# Patient Record
Sex: Male | Born: 1950
Health system: Southern US, Community
[De-identification: ages and names within clinical notes are randomized; demographics above are authoritative.]

## PROBLEM LIST (undated history)

## (undated) DIAGNOSIS — F32A Depression, unspecified: Secondary | ICD-10-CM

## (undated) DIAGNOSIS — I77811 Abdominal aortic ectasia: Secondary | ICD-10-CM

## (undated) DIAGNOSIS — M1A00X0 Idiopathic chronic gout, unspecified site, without tophus (tophi): Secondary | ICD-10-CM

## (undated) DIAGNOSIS — M109 Gout, unspecified: Secondary | ICD-10-CM

## (undated) DIAGNOSIS — M51369 Other intervertebral disc degeneration, lumbar region without mention of lumbar back pain or lower extremity pain: Secondary | ICD-10-CM

## (undated) DIAGNOSIS — N400 Enlarged prostate without lower urinary tract symptoms: Secondary | ICD-10-CM

## (undated) DIAGNOSIS — N189 Chronic kidney disease, unspecified: Secondary | ICD-10-CM

## (undated) DIAGNOSIS — M1710 Unilateral primary osteoarthritis, unspecified knee: Secondary | ICD-10-CM

## (undated) DIAGNOSIS — K219 Gastro-esophageal reflux disease without esophagitis: Secondary | ICD-10-CM

## (undated) DIAGNOSIS — E785 Hyperlipidemia, unspecified: Secondary | ICD-10-CM

## (undated) DIAGNOSIS — I10 Essential (primary) hypertension: Secondary | ICD-10-CM

## (undated) DIAGNOSIS — M1A9XX0 Chronic gout, unspecified, without tophus (tophi): Secondary | ICD-10-CM

## (undated) DIAGNOSIS — M5136 Other intervertebral disc degeneration, lumbar region: Secondary | ICD-10-CM

## (undated) DIAGNOSIS — E119 Type 2 diabetes mellitus without complications: Secondary | ICD-10-CM

## (undated) DIAGNOSIS — M1990 Unspecified osteoarthritis, unspecified site: Secondary | ICD-10-CM

## (undated) DIAGNOSIS — F329 Major depressive disorder, single episode, unspecified: Secondary | ICD-10-CM

## (undated) HISTORY — PX: CHOLECYSTECTOMY: SHX55

## (undated) HISTORY — DX: Major depressive disorder, single episode, unspecified: F32.9

## (undated) HISTORY — PX: SPINE SURGERY: SHX786

## (undated) HISTORY — DX: Chronic kidney disease, unspecified: N18.9

## (undated) HISTORY — PX: CARPAL TUNNEL RELEASE: SHX101

## (undated) HISTORY — DX: Unspecified osteoarthritis, unspecified site: M19.90

## (undated) HISTORY — PX: BACK SURGERY: SHX140

## (undated) HISTORY — DX: Hyperlipidemia, unspecified: E78.5

## (undated) HISTORY — PX: APPENDECTOMY: SHX54

## (undated) HISTORY — DX: Gout, unspecified: M10.9

## (undated) HISTORY — DX: Gastro-esophageal reflux disease without esophagitis: K21.9

## (undated) HISTORY — DX: Depression, unspecified: F32.A

## (undated) HISTORY — PX: COLONOSCOPY: SHX174

## (undated) HISTORY — DX: Type 2 diabetes mellitus without complications: E11.9

## (undated) HISTORY — DX: Essential (primary) hypertension: I10

---

## 2004-05-29 ENCOUNTER — Other Ambulatory Visit: Payer: Self-pay

## 2006-06-04 ENCOUNTER — Other Ambulatory Visit: Payer: Self-pay

## 2006-06-04 ENCOUNTER — Emergency Department: Payer: Self-pay | Admitting: Emergency Medicine

## 2007-06-25 ENCOUNTER — Ambulatory Visit: Payer: Self-pay

## 2007-09-19 ENCOUNTER — Ambulatory Visit: Payer: Self-pay | Admitting: Family Medicine

## 2010-03-12 ENCOUNTER — Ambulatory Visit: Payer: Self-pay | Admitting: Specialist

## 2010-03-19 ENCOUNTER — Ambulatory Visit: Payer: Self-pay | Admitting: Specialist

## 2012-02-01 ENCOUNTER — Ambulatory Visit: Payer: Self-pay | Admitting: Chiropractic Medicine

## 2013-09-06 ENCOUNTER — Ambulatory Visit: Payer: Self-pay | Admitting: Unknown Physician Specialty

## 2013-09-06 LAB — HM COLONOSCOPY

## 2014-02-10 ENCOUNTER — Other Ambulatory Visit: Payer: Self-pay | Admitting: Rheumatology

## 2014-02-10 LAB — SYNOVIAL CELL COUNT + DIFF, W/ CRYSTALS
BASOS ABS: 0 %
Eosinophil: 0 %
LYMPHS PCT: 3 %
NEUTROS PCT: 38 %
Nucleated Cell Count: 513 /mm3
OTHER MONONUCLEAR CELLS: 59 %
Other Cells BF: 0 %

## 2014-06-16 ENCOUNTER — Other Ambulatory Visit: Payer: Self-pay | Admitting: Family Medicine

## 2014-06-16 LAB — LIPID PANEL
Cholesterol: 200 mg/dL (ref 0–200)
HDL Cholesterol: 29 mg/dL — ABNORMAL LOW (ref 40–60)
Ldl Cholesterol, Calc: 109 mg/dL — ABNORMAL HIGH (ref 0–100)
TRIGLYCERIDES: 309 mg/dL — AB (ref 0–200)
VLDL Cholesterol, Calc: 62 mg/dL — ABNORMAL HIGH (ref 5–40)

## 2014-06-16 LAB — COMPREHENSIVE METABOLIC PANEL
ALK PHOS: 89 U/L
ALT: 36 U/L (ref 12–78)
ANION GAP: 8 (ref 7–16)
Albumin: 4 g/dL (ref 3.4–5.0)
BUN: 22 mg/dL — ABNORMAL HIGH (ref 7–18)
Bilirubin,Total: 0.5 mg/dL (ref 0.2–1.0)
CALCIUM: 8.8 mg/dL (ref 8.5–10.1)
Chloride: 105 mmol/L (ref 98–107)
Co2: 26 mmol/L (ref 21–32)
Creatinine: 1.51 mg/dL — ABNORMAL HIGH (ref 0.60–1.30)
EGFR (African American): 56 — ABNORMAL LOW
EGFR (Non-African Amer.): 48 — ABNORMAL LOW
GLUCOSE: 143 mg/dL — AB (ref 65–99)
Osmolality: 283 (ref 275–301)
Potassium: 3.9 mmol/L (ref 3.5–5.1)
SGOT(AST): 28 U/L (ref 15–37)
SODIUM: 139 mmol/L (ref 136–145)
TOTAL PROTEIN: 7.4 g/dL (ref 6.4–8.2)

## 2014-06-16 LAB — HEMOGLOBIN A1C: Hemoglobin A1C: 7.5 % — ABNORMAL HIGH (ref 4.2–6.3)

## 2014-06-16 LAB — URINALYSIS, COMPLETE
BILIRUBIN, UR: NEGATIVE
Bacteria: NONE SEEN
Blood: NEGATIVE
GLUCOSE, UR: NEGATIVE mg/dL (ref 0–75)
Ketone: NEGATIVE
LEUKOCYTE ESTERASE: NEGATIVE
Nitrite: NEGATIVE
PH: 5 (ref 4.5–8.0)
Protein: NEGATIVE
RBC,UR: NONE SEEN /HPF (ref 0–5)
SPECIFIC GRAVITY: 1.016 (ref 1.003–1.030)
Squamous Epithelial: NONE SEEN

## 2014-06-16 LAB — CBC WITH DIFFERENTIAL/PLATELET
Basophil #: 0.1 10*3/uL (ref 0.0–0.1)
Basophil %: 1.2 %
EOS ABS: 0.4 10*3/uL (ref 0.0–0.7)
Eosinophil %: 5.6 %
HCT: 45 % (ref 40.0–52.0)
HGB: 15.1 g/dL (ref 13.0–18.0)
LYMPHS ABS: 1.3 10*3/uL (ref 1.0–3.6)
Lymphocyte %: 20.7 %
MCH: 30.2 pg (ref 26.0–34.0)
MCHC: 33.5 g/dL (ref 32.0–36.0)
MCV: 90 fL (ref 80–100)
Monocyte #: 0.4 x10 3/mm (ref 0.2–1.0)
Monocyte %: 6.2 %
NEUTROS ABS: 4.2 10*3/uL (ref 1.4–6.5)
Neutrophil %: 66.3 %
PLATELETS: 209 10*3/uL (ref 150–440)
RBC: 4.99 10*6/uL (ref 4.40–5.90)
RDW: 13.3 % (ref 11.5–14.5)
WBC: 6.3 10*3/uL (ref 3.8–10.6)

## 2014-06-16 LAB — URIC ACID: URIC ACID: 6.5 mg/dL (ref 3.5–7.2)

## 2014-06-17 LAB — PSA: PSA: 1.8 ng/mL (ref 0.0–4.0)

## 2014-09-10 ENCOUNTER — Other Ambulatory Visit: Payer: Self-pay | Admitting: Family Medicine

## 2014-09-10 LAB — BASIC METABOLIC PANEL
ANION GAP: 8 (ref 7–16)
BUN: 19 mg/dL — AB (ref 7–18)
CO2: 27 mmol/L (ref 21–32)
CREATININE: 1.39 mg/dL — AB (ref 0.60–1.30)
Calcium, Total: 8.8 mg/dL (ref 8.5–10.1)
Chloride: 103 mmol/L (ref 98–107)
EGFR (Non-African Amer.): 55 — ABNORMAL LOW
GLUCOSE: 143 mg/dL — AB (ref 65–99)
OSMOLALITY: 280 (ref 275–301)
Potassium: 3.5 mmol/L (ref 3.5–5.1)
Sodium: 138 mmol/L (ref 136–145)

## 2014-09-10 LAB — HEMOGLOBIN A1C: HEMOGLOBIN A1C: 7.4 % — AB (ref 4.2–6.3)

## 2014-11-05 ENCOUNTER — Ambulatory Visit: Payer: Self-pay | Admitting: Family Medicine

## 2014-11-20 ENCOUNTER — Encounter: Payer: Self-pay | Admitting: Family Medicine

## 2014-11-20 LAB — BASIC METABOLIC PANEL WITH GFR
Anion Gap: 9 (ref 7–16)
BUN: 28 mg/dL — ABNORMAL HIGH (ref 7–18)
Calcium, Total: 8.8 mg/dL (ref 8.5–10.1)
Chloride: 107 mmol/L (ref 98–107)
Co2: 26 mmol/L (ref 21–32)
Creatinine: 1.62 mg/dL — ABNORMAL HIGH (ref 0.60–1.30)
EGFR (African American): 56 — ABNORMAL LOW
EGFR (Non-African Amer.): 46 — ABNORMAL LOW
Glucose: 109 mg/dL — ABNORMAL HIGH (ref 65–99)
Osmolality: 289 (ref 275–301)
Potassium: 3.8 mmol/L (ref 3.5–5.1)
Sodium: 142 mmol/L (ref 136–145)

## 2014-11-20 LAB — SGOT (AST)(ARMC): SGOT(AST): 30 U/L (ref 15–37)

## 2014-11-20 LAB — LIPID PANEL
CHOLESTEROL: 221 mg/dL — AB (ref 0–200)
HDL: 37 mg/dL — AB (ref 40–60)
LDL CHOLESTEROL, CALC: 134 mg/dL — AB (ref 0–100)
Triglycerides: 251 mg/dL — ABNORMAL HIGH (ref 0–200)
VLDL Cholesterol, Calc: 50 mg/dL — ABNORMAL HIGH (ref 5–40)

## 2014-11-20 LAB — HEMOGLOBIN A1C: HEMOGLOBIN A1C: 7.4 % — AB (ref 4.2–6.3)

## 2014-11-20 LAB — ALT: SGPT (ALT): 48 U/L

## 2014-11-20 LAB — URIC ACID: Uric Acid: 5.8 mg/dL (ref 3.5–7.2)

## 2014-12-12 ENCOUNTER — Encounter: Payer: Self-pay | Admitting: Family Medicine

## 2015-03-25 ENCOUNTER — Other Ambulatory Visit
Admit: 2015-03-25 | Disposition: A | Discharge status: Home / Self Care | Payer: Self-pay | Attending: Family Medicine | Admitting: Family Medicine

## 2015-03-25 LAB — URINALYSIS, COMPLETE
Bacteria: NONE SEEN
Bilirubin,UR: NEGATIVE
Blood: NEGATIVE
Glucose,UR: NEGATIVE mg/dL (ref 0–75)
Ketone: NEGATIVE
Leukocyte Esterase: NEGATIVE
NITRITE: NEGATIVE
PH: 5 (ref 4.5–8.0)
Specific Gravity: 1.018 (ref 1.003–1.030)

## 2015-03-25 LAB — COMPREHENSIVE METABOLIC PANEL
ALBUMIN: 4.5 g/dL
AST: 32 U/L
Alkaline Phosphatase: 88 U/L
Anion Gap: 7 (ref 7–16)
BILIRUBIN TOTAL: 0.6 mg/dL
BUN: 23 mg/dL — AB
CALCIUM: 9.4 mg/dL
Chloride: 106 mmol/L
Co2: 26 mmol/L
Creatinine: 1.32 mg/dL — ABNORMAL HIGH
EGFR (African American): >60
EGFR (Non-African Amer.): 57 — ABNORMAL LOW
Glucose: 94 mg/dL
POTASSIUM: 4 mmol/L
SGPT (ALT): 28 U/L
SODIUM: 139 mmol/L
TOTAL PROTEIN: 7.3 g/dL

## 2015-03-25 LAB — CBC WITH DIFFERENTIAL/PLATELET
BASOS PCT: 0.8 %
Basophil #: 0 10*3/uL (ref 0.0–0.1)
Eosinophil #: 0.1 10*3/uL (ref 0.0–0.7)
Eosinophil %: 2.6 %
HCT: 45.5 % (ref 40.0–52.0)
HGB: 14.8 g/dL (ref 13.0–18.0)
Lymphocyte #: 1.4 10*3/uL (ref 1.0–3.6)
Lymphocyte %: 24.7 %
MCH: 29.3 pg (ref 26.0–34.0)
MCHC: 32.5 g/dL (ref 32.0–36.0)
MCV: 90 fL (ref 80–100)
MONOS PCT: 6.5 %
Monocyte #: 0.4 x10 3/mm (ref 0.2–1.0)
Neutrophil #: 3.7 10*3/uL (ref 1.4–6.5)
Neutrophil %: 65.4 %
Platelet: 230 10*3/uL (ref 150–440)
RBC: 5.05 10*6/uL (ref 4.40–5.90)
RDW: 13.5 % (ref 11.5–14.5)
WBC: 5.6 10*3/uL (ref 3.8–10.6)

## 2015-03-25 LAB — LIPID PANEL
Cholesterol: 203 mg/dL — ABNORMAL HIGH
HDL: 33 mg/dL — AB
LDL CHOLESTEROL, CALC: 124 mg/dL — AB
TRIGLYCERIDES: 232 mg/dL — AB
VLDL CHOLESTEROL, CALC: 46 mg/dL — AB

## 2015-03-25 LAB — URIC ACID: URIC ACID: 6.1 mg/dL

## 2015-03-25 LAB — HEMOGLOBIN A1C: Hemoglobin A1C: 7.6 % — ABNORMAL HIGH

## 2015-03-25 LAB — TSH: Thyroid Stimulating Horm: 2.723 u[IU]/mL

## 2015-03-26 LAB — PSA: PSA: 2.5 ng/mL (ref 0.0–4.0)

## 2015-06-19 ENCOUNTER — Other Ambulatory Visit: Payer: Self-pay | Admitting: Family Medicine

## 2015-06-19 NOTE — Telephone Encounter (Signed)
Should have enough uloric to last until October per PP refills- is due for colchicine, but I'm not sure how to separate the 2. Can we check with pharmacy on uloric and OK to give refill of colchicine on the phone.

## 2015-06-19 NOTE — Telephone Encounter (Signed)
[facility] was sent to [place] so I called in the old prescription.

## 2015-07-05 ENCOUNTER — Other Ambulatory Visit: Payer: Self-pay | Admitting: Family Medicine

## 2015-07-09 ENCOUNTER — Other Ambulatory Visit: Payer: Self-pay | Admitting: Family Medicine

## 2015-07-09 NOTE — Telephone Encounter (Signed)
[facility] chart reviewed, as this doesn't match med list in [place] and patient has not been seen since we went to Epic I'm going to great detail to make sure prescriptions are correct the first time given through Epic, especially since this isn't my patient I read every office note back to March of 2015 Instructions in the med list for Levemir don't give an amount but just says "twice a day" Notes from March 2015 and October 2015 in the subjective section says 50 units of long-acting insulin just at bedtime There is no notation about increasing anything to 60 units or that it's twice a day I finally found 60 units twice a day hand-written in a message January 05, 2015 Approved

## 2015-07-10 ENCOUNTER — Telehealth: Payer: Self-pay

## 2015-07-10 NOTE — Telephone Encounter (Signed)
Pt called stated someone called him about his medication but he can't remember who called. Please call pt @ [phone]. Thanks.

## 2015-07-13 DIAGNOSIS — I10 Essential (primary) hypertension: Secondary | ICD-10-CM | POA: Insufficient documentation

## 2015-07-13 DIAGNOSIS — E1142 Type 2 diabetes mellitus with diabetic polyneuropathy: Secondary | ICD-10-CM | POA: Insufficient documentation

## 2015-07-13 DIAGNOSIS — E785 Hyperlipidemia, unspecified: Secondary | ICD-10-CM | POA: Insufficient documentation

## 2015-07-13 NOTE — Telephone Encounter (Signed)
Spoke with patient, he believes the call was about his Levemir, ML filled on 07/09/15 for 90 day supply

## 2015-07-27 ENCOUNTER — Other Ambulatory Visit
Admission: RE | Admit: 2015-07-27 | Discharge: 2015-07-27 | Disposition: A | Discharge status: Home / Self Care | Payer: Managed Care, Other (non HMO) | Source: Ambulatory Visit | Attending: Family Medicine | Admitting: Family Medicine

## 2015-07-27 DIAGNOSIS — E119 Type 2 diabetes mellitus without complications: Secondary | ICD-10-CM | POA: Insufficient documentation

## 2015-07-27 LAB — HEMOGLOBIN A1C: HEMOGLOBIN A1C: 6.8 % — AB (ref 4.0–6.0)

## 2015-07-30 ENCOUNTER — Ambulatory Visit (INDEPENDENT_AMBULATORY_CARE_PROVIDER_SITE_OTHER): Payer: Managed Care, Other (non HMO) | Admitting: Family Medicine

## 2015-07-30 ENCOUNTER — Encounter: Payer: Self-pay | Admitting: Family Medicine

## 2015-07-30 VITALS — BP 162/80 | HR 83 | Temp 97.6°F | Ht 71.3 in | Wt 248.0 lb

## 2015-07-30 DIAGNOSIS — E119 Type 2 diabetes mellitus without complications: Secondary | ICD-10-CM

## 2015-07-30 DIAGNOSIS — E785 Hyperlipidemia, unspecified: Secondary | ICD-10-CM

## 2015-07-30 DIAGNOSIS — I10 Essential (primary) hypertension: Secondary | ICD-10-CM | POA: Diagnosis not present

## 2015-07-30 NOTE — Progress Notes (Signed)
   BP 162/80 mmHg  Pulse 83  Temp(Src) 97.6 F (36.4 C)  Ht 5' 11.3" (1.811 m)  Wt 248 lb (112.492 kg)  BMI 34.30 kg/m2  SpO2 97%   Subjective:    Patient ID: Jerry Pollard, male    DOB: 1951/11/27, 64 y.o.   MRN: [id number]  HPI: Jerry Pollard is a 64 y.o. male  Chief Complaint  Patient presents with  . Diabetes   patient's blood sugars doing well no complaints from medications Blood pressure on home monitoring his up and down only taking Benzapril Peripheral neuropathy stable on gabapentin Lipid stable on medications Uses rare tramadol No gout attacks or symptoms.  Relevant past medical, surgical, family and social history reviewed and updated as indicated. Interim medical history since our last visit reviewed. Allergies and medications reviewed and updated.  Review of Systems  Constitutional: Negative.   Respiratory: Negative.   Cardiovascular: Negative.     Per HPI unless specifically indicated above     Objective:    BP 162/80 mmHg  Pulse 83  Temp(Src) 97.6 F (36.4 C)  Ht 5' 11.3" (1.811 m)  Wt 248 lb (112.492 kg)  BMI 34.30 kg/m2  SpO2 97%  Wt Readings from Last 3 Encounters:  07/30/15 248 lb (112.492 kg)  04/08/15 244 lb (110.678 kg)    Physical Exam  Constitutional: He is oriented to person, place, and time. He appears well-developed and well-nourished. No distress.  HENT:  Head: Normocephalic and atraumatic.  Right Ear: Hearing normal.  Left Ear: Hearing normal.  Nose: Nose normal.  Eyes: Conjunctivae and lids are normal. Right eye exhibits no discharge. Left eye exhibits no discharge. No scleral icterus.  Pulmonary/Chest: Effort normal. No respiratory distress.  Musculoskeletal: Normal range of motion.  Neurological: He is alert and oriented to person, place, and time.  Diminished sensation feet  Skin: Skin is intact. No rash noted.  Psychiatric: He has a normal mood and affect. His speech is normal and behavior is normal. Judgment and thought  content normal. Cognition and memory are normal.    Results for orders placed or performed during the hospital encounter of 07/27/15  Hemoglobin A1c  Result Value Ref Range   Hgb A1c MFr Bld 6.8 (H) 4.0 - 6.0 %      Assessment & Plan:   Problem List Items Addressed This Visit      Cardiovascular and Mediastinum   Hypertension - Primary    Poor control today , will observe blood pressure for now if still up next visit Will consider       Relevant Orders   Basic metabolic panel     Endocrine   Diabetes mellitus without complication    The current medical regimen is effective;  continue present plan and medications.       Relevant Orders   Bayer DCA Hb A1c Waived     Other   Hyperlipidemia    The current medical regimen is effective;  continue present plan and medications.       Relevant Orders   LP+ALT+AST+Glu [place], Waived       Follow up plan: Return in about 3 months (around 10/30/2015), or if symptoms worsen or fail to improve.

## 2015-07-30 NOTE — Assessment & Plan Note (Signed)
The current medical regimen is effective;  continue present plan and medications.  

## 2015-07-30 NOTE — Assessment & Plan Note (Signed)
Poor control today , will observe blood pressure for now if still up next visit Will consider

## 2015-09-09 ENCOUNTER — Other Ambulatory Visit: Payer: Self-pay | Admitting: Family Medicine

## 2015-09-10 NOTE — Telephone Encounter (Signed)
Your patient 

## 2015-09-21 ENCOUNTER — Telehealth: Payer: Self-pay | Admitting: Family Medicine

## 2015-09-21 MED ORDER — ULORIC 40 MG PO TABS: 20.0000 mg | ORAL_TABLET | Freq: Every day | ORAL | Status: DC

## 2015-09-21 NOTE — Telephone Encounter (Signed)
Pt called to let us know that his rx was sent in for 30 day supply but with his insurance he needs a 90 day supply. He would like it to go to [facility]

## 2015-10-16 ENCOUNTER — Other Ambulatory Visit: Payer: Self-pay | Admitting: Family Medicine

## 2015-10-16 NOTE — Telephone Encounter (Signed)
rx for levemir needs to be 90 day supply instead of 30 days

## 2015-10-28 ENCOUNTER — Other Ambulatory Visit
Admission: RE | Admit: 2015-10-28 | Discharge: 2015-10-28 | Disposition: A | Discharge status: Home / Self Care | Payer: Managed Care, Other (non HMO) | Source: Ambulatory Visit | Attending: Family Medicine | Admitting: Family Medicine

## 2015-10-28 DIAGNOSIS — I10 Essential (primary) hypertension: Secondary | ICD-10-CM | POA: Diagnosis present

## 2015-10-28 LAB — LIPID PANEL
CHOLESTEROL: 188 mg/dL (ref 0–200)
HDL: 32 mg/dL — ABNORMAL LOW (ref >40)
LDL Cholesterol: 91 mg/dL (ref 0–99)
TRIGLYCERIDES: 327 mg/dL — AB (ref <150)
Total CHOL/HDL Ratio: 5.9 RATIO
VLDL: 65 mg/dL — ABNORMAL HIGH (ref 0–40)

## 2015-10-28 LAB — BASIC METABOLIC PANEL
ANION GAP: 10 (ref 5–15)
BUN: 30 mg/dL — ABNORMAL HIGH (ref 6–20)
CO2: 23 mmol/L (ref 22–32)
Calcium: 9.6 mg/dL (ref 8.9–10.3)
Chloride: 107 mmol/L (ref 101–111)
Creatinine, Ser: 1.35 mg/dL — ABNORMAL HIGH (ref 0.61–1.24)
GFR calc Af Amer: >60 mL/min (ref >60)
GFR calc non Af Amer: 54 mL/min — ABNORMAL LOW (ref >60)
Glucose, Bld: 128 mg/dL — ABNORMAL HIGH (ref 65–99)
POTASSIUM: 4.1 mmol/L (ref 3.5–5.1)
SODIUM: 140 mmol/L (ref 135–145)

## 2015-10-28 LAB — HEMOGLOBIN A1C: HEMOGLOBIN A1C: 6.8 % — AB (ref 4.0–6.0)

## 2015-10-28 LAB — AST: AST: 27 U/L (ref 15–41)

## 2015-10-28 LAB — ALT: ALT: 32 U/L (ref 17–63)

## 2015-11-09 ENCOUNTER — Ambulatory Visit (INDEPENDENT_AMBULATORY_CARE_PROVIDER_SITE_OTHER): Payer: Managed Care, Other (non HMO) | Admitting: Family Medicine

## 2015-11-09 ENCOUNTER — Encounter: Payer: Self-pay | Admitting: Family Medicine

## 2015-11-09 DIAGNOSIS — I10 Essential (primary) hypertension: Secondary | ICD-10-CM | POA: Diagnosis not present

## 2015-11-09 DIAGNOSIS — E785 Hyperlipidemia, unspecified: Secondary | ICD-10-CM | POA: Diagnosis not present

## 2015-11-09 DIAGNOSIS — E119 Type 2 diabetes mellitus without complications: Secondary | ICD-10-CM

## 2015-11-09 MED ORDER — AMLODIPINE BESYLATE 5 MG PO TABS: 5.0000 mg | ORAL_TABLET | Freq: Every day | ORAL | Status: DC

## 2015-11-09 NOTE — Assessment & Plan Note (Signed)
Due to long term poor control and wt gain will increase meds Add amlodipine

## 2015-11-09 NOTE — Assessment & Plan Note (Signed)
The current medical regimen is effective;  continue present plan and medications.  

## 2015-11-09 NOTE — Progress Notes (Signed)
BP 145/82 mmHg  Pulse 75  Temp(Src) 97.9 F (36.6 C)  Ht 5' 11.5" (1.816 m)  Wt 251 lb (113.853 kg)  BMI 34.52 kg/m2  SpO2 96%   Subjective:    Patient ID: Jerry Pollard, male    DOB: Jan 09, 1951, 64 y.o.   MRN: [id number]  HPI: Jerry Pollard is a 64 y.o. male  Chief Complaint  Patient presents with  . Diabetes  . Hyperlipidemia  . Hypertension   patient blood sugar has been up and down has some low blood sugar spells in the mornings 3 predictably eat a cookie and is able to keep going Blood pressure home checks his occasionally good occasionally elevated Review of chart here last office visit blood pressure was also elevated Chol has doen well  Relevant past medical, surgical, family and social history reviewed and updated as indicated. Interim medical history since our last visit reviewed. Allergies and medications reviewed and updated.  Review of Systems  Constitutional: Negative.   Respiratory: Negative.   Cardiovascular: Negative.     Per HPI unless specifically indicated above     Objective:    BP 145/82 mmHg  Pulse 75  Temp(Src) 97.9 F (36.6 C)  Ht 5' 11.5" (1.816 m)  Wt 251 lb (113.853 kg)  BMI 34.52 kg/m2  SpO2 96%  Wt Readings from Last 3 Encounters:  11/09/15 251 lb (113.853 kg)  07/30/15 248 lb (112.492 kg)  04/08/15 244 lb (110.678 kg)    Physical Exam  Constitutional: He is oriented to person, place, and time. He appears well-developed and well-nourished. No distress.  HENT:  Head: Normocephalic and atraumatic.  Right Ear: Hearing normal.  Left Ear: Hearing normal.  Nose: Nose normal.  Eyes: Conjunctivae and lids are normal. Right eye exhibits no discharge. Left eye exhibits no discharge. No scleral icterus.  Cardiovascular: Normal rate, regular rhythm and normal heart sounds.   Pulmonary/Chest: Effort normal and breath sounds normal. No respiratory distress.  Musculoskeletal: Normal range of motion.  Neurological: He is alert and oriented  to person, place, and time.  Skin: Skin is intact. No rash noted.  Psychiatric: He has a normal mood and affect. His speech is normal and behavior is normal. Judgment and thought content normal. Cognition and memory are normal.    Results for orders placed or performed during the hospital encounter of 10/28/15  Hemoglobin A1c  Result Value Ref Range   Hgb A1c MFr Bld 6.8 (H) 4.0 - 6.0 %  Lipid panel  Result Value Ref Range   Cholesterol 188 0 - 200 mg/dL   Triglycerides 327 (H) <150 mg/dL   HDL 32 (L) >40 mg/dL   Total CHOL/HDL Ratio 5.9 RATIO   VLDL 65 (H) 0 - 40 mg/dL   LDL Cholesterol 91 0 - 99 mg/dL  ALT  Result Value Ref Range   ALT 32 17 - 63 U/L  AST  Result Value Ref Range   AST 27 15 - 41 U/L  Basic metabolic panel  Result Value Ref Range   Sodium 140 135 - 145 mmol/L   Potassium 4.1 3.5 - 5.1 mmol/L   Chloride 107 101 - 111 mmol/L   CO2 23 22 - 32 mmol/L   Glucose, Bld 128 (H) 65 - 99 mg/dL   BUN 30 (H) 6 - 20 mg/dL   Creatinine, Ser 1.35 (H) 0.61 - 1.24 mg/dL   Calcium 9.6 8.9 - 10.3 mg/dL   GFR calc non Af Amer 54 (L) >60  mL/min   GFR calc Af Amer >60 >60 mL/min   Anion gap 10 5 - 15      Assessment & Plan:   Problem List Items Addressed This Visit      Cardiovascular and Mediastinum   Hypertension    Due to long term poor control and wt gain will increase meds Add amlodipine       Relevant Medications   amLODipine (NORVASC) 5 MG tablet     Endocrine   Diabetes mellitus without complication (HCC)    The current medical regimen is effective;  continue present plan and medications.         Other   Hyperlipidemia    The current medical regimen is effective;  continue present plan and medications.       Relevant Medications   amLODipine (NORVASC) 5 MG tablet       Follow up plan: Return in about 3 months (around 02/09/2016) for a1c and BP check.

## 2016-01-06 ENCOUNTER — Other Ambulatory Visit: Payer: Self-pay

## 2016-01-06 MED ORDER — BENAZEPRIL HCL 40 MG PO TABS: 40.0000 mg | ORAL_TABLET | Freq: Every day | ORAL | Status: DC

## 2016-01-06 NOTE — Telephone Encounter (Signed)
[phone] [facility]  Patient's appointment 02/10/16

## 2016-01-06 NOTE — Telephone Encounter (Signed)
Last creatinine and K+ reviewed; Rx approved 

## 2016-01-19 ENCOUNTER — Other Ambulatory Visit: Payer: Self-pay | Admitting: Family Medicine

## 2016-02-10 ENCOUNTER — Encounter: Payer: Self-pay | Admitting: Family Medicine

## 2016-02-10 ENCOUNTER — Ambulatory Visit (INDEPENDENT_AMBULATORY_CARE_PROVIDER_SITE_OTHER): Payer: PPO | Admitting: Family Medicine

## 2016-02-10 VITALS — BP 126/76 | HR 90 | Temp 98.1°F | Ht 71.4 in | Wt 249.0 lb

## 2016-02-10 DIAGNOSIS — E119 Type 2 diabetes mellitus without complications: Secondary | ICD-10-CM

## 2016-02-10 DIAGNOSIS — I10 Essential (primary) hypertension: Secondary | ICD-10-CM | POA: Diagnosis not present

## 2016-02-10 DIAGNOSIS — Z113 Encounter for screening for infections with a predominantly sexual mode of transmission: Secondary | ICD-10-CM | POA: Diagnosis not present

## 2016-02-10 DIAGNOSIS — E785 Hyperlipidemia, unspecified: Secondary | ICD-10-CM

## 2016-02-10 LAB — BAYER DCA HB A1C WAIVED: HB A1C: 8.1 % — AB (ref <7.0)

## 2016-02-10 MED ORDER — EXENATIDE ER 2 MG ~~LOC~~ PEN: 2.0000 mg | PEN_INJECTOR | SUBCUTANEOUS | Status: DC

## 2016-02-10 NOTE — Assessment & Plan Note (Addendum)
Discuss with patient A1c elevated patient's doing better with diet nutrition now that his cold is better We will continue with his insulins changed to [place] was not covered

## 2016-02-10 NOTE — Assessment & Plan Note (Signed)
The current medical regimen is effective;  continue present plan and medications.  

## 2016-02-10 NOTE — Progress Notes (Signed)
BP 126/76 mmHg  Pulse 90  Temp(Src) 98.1 F (36.7 C)  Ht 5' 11.4" (1.814 m)  Wt 249 lb (112.946 kg)  BMI 34.32 kg/m2  SpO2 98%   Subjective:    Patient ID: Jerry Pollard, male    DOB: 1951-02-18, 65 y.o.   MRN: [id number]  HPI: Jerry Pollard is a 65 y.o. male  Chief Complaint  Patient presents with  . Diabetes   patient blood sugars been elevated because of severe bronchitis he had back in January was sick for over a month and blood sugars were elevated Patient also has been doing fine on [place] but will not be able to get after next prescription as insurance restrictions. Blood pressure doing well  No gout issues Cholesterol stable   Relevant past medical, surgical, family and social history reviewed and updated as indicated. Interim medical history since our last visit reviewed. Allergies and medications reviewed and updated.  Review of Systems  Constitutional: Negative.   Respiratory: Negative.   Cardiovascular: Negative.     Per HPI unless specifically indicated above     Objective:    BP 126/76 mmHg  Pulse 90  Temp(Src) 98.1 F (36.7 C)  Ht 5' 11.4" (1.814 m)  Wt 249 lb (112.946 kg)  BMI 34.32 kg/m2  SpO2 98%  Wt Readings from Last 3 Encounters:  02/10/16 249 lb (112.946 kg)  11/09/15 251 lb (113.853 kg)  07/30/15 248 lb (112.492 kg)    Physical Exam  Constitutional: He is oriented to person, place, and time. He appears well-developed and well-nourished. No distress.  HENT:  Head: Normocephalic and atraumatic.  Right Ear: Hearing normal.  Left Ear: Hearing normal.  Nose: Nose normal.  Eyes: Conjunctivae and lids are normal. Right eye exhibits no discharge. Left eye exhibits no discharge. No scleral icterus.  Cardiovascular: Normal rate, regular rhythm and normal heart sounds.   Pulmonary/Chest: Effort normal and breath sounds normal. No respiratory distress.  Musculoskeletal: Normal range of motion.  Neurological: He is alert and oriented to  person, place, and time.  Skin: Skin is intact. No rash noted.  Psychiatric: He has a normal mood and affect. His speech is normal and behavior is normal. Judgment and thought content normal. Cognition and memory are normal.    Results for orders placed or performed during the hospital encounter of 10/28/15  Hemoglobin A1c  Result Value Ref Range   Hgb A1c MFr Bld 6.8 (H) 4.0 - 6.0 %  Lipid panel  Result Value Ref Range   Cholesterol 188 0 - 200 mg/dL   Triglycerides 327 (H) <150 mg/dL   HDL 32 (L) >40 mg/dL   Total CHOL/HDL Ratio 5.9 RATIO   VLDL 65 (H) 0 - 40 mg/dL   LDL Cholesterol 91 0 - 99 mg/dL  ALT  Result Value Ref Range   ALT 32 17 - 63 U/L  AST  Result Value Ref Range   AST 27 15 - 41 U/L  Basic metabolic panel  Result Value Ref Range   Sodium 140 135 - 145 mmol/L   Potassium 4.1 3.5 - 5.1 mmol/L   Chloride 107 101 - 111 mmol/L   CO2 23 22 - 32 mmol/L   Glucose, Bld 128 (H) 65 - 99 mg/dL   BUN 30 (H) 6 - 20 mg/dL   Creatinine, Ser 1.35 (H) 0.61 - 1.24 mg/dL   Calcium 9.6 8.9 - 10.3 mg/dL   GFR calc non Af Amer 54 (L) >60 mL/min  GFR calc Af Amer >60 >60 mL/min   Anion gap 10 5 - 15      Assessment & Plan:   Problem List Items Addressed This Visit      Cardiovascular and Mediastinum   Hypertension    The current medical regimen is effective;  continue present plan and medications.         Endocrine   Diabetes mellitus without complication ([place])    Discuss with patient A1c elevated patient's doing better with diet nutrition now that his cold is better We will continue with his insulins changed to [place] was not covered      Relevant Medications   Exenatide ER 2 MG PEN   Other Relevant Orders   Bayer DCA Hb A1c Waived     Other   Hyperlipidemia    The current medical regimen is effective;  continue present plan and medications.        Other Visit Diagnoses    Routine screening for STI (sexually transmitted infection)    -   Primary    Relevant Orders    Hepatitis C Antibody        Follow up plan: Return in about 3 months (around 05/12/2016) for Physical Exam welcome to Medicare exam.

## 2016-02-11 ENCOUNTER — Other Ambulatory Visit: Payer: Self-pay | Admitting: Family Medicine

## 2016-02-11 LAB — HEPATITIS C ANTIBODY

## 2016-02-14 ENCOUNTER — Other Ambulatory Visit: Payer: Self-pay | Admitting: Family Medicine

## 2016-02-18 ENCOUNTER — Other Ambulatory Visit: Payer: Self-pay | Admitting: Family Medicine

## 2016-02-18 ENCOUNTER — Telehealth: Payer: Self-pay | Admitting: Family Medicine

## 2016-02-18 DIAGNOSIS — N183 Chronic kidney disease, stage 3 (moderate): Principal | ICD-10-CM

## 2016-02-18 DIAGNOSIS — E1122 Type 2 diabetes mellitus with diabetic chronic kidney disease: Secondary | ICD-10-CM

## 2016-02-18 NOTE — Telephone Encounter (Signed)
Pt would like to speak to MAC about some personal things. He wouldn't give me any details sorry.

## 2016-02-24 ENCOUNTER — Other Ambulatory Visit: Payer: Self-pay | Admitting: Family Medicine

## 2016-02-25 NOTE — Telephone Encounter (Signed)
Patient having some trouble with his diabetic Rx's and would like a call

## 2016-02-25 NOTE — Telephone Encounter (Signed)
Call ask how many patient needs for 3 months supply please

## 2016-02-26 ENCOUNTER — Telehealth: Payer: Self-pay | Admitting: Family Medicine

## 2016-02-26 NOTE — Telephone Encounter (Signed)
Pharmacy called and would like clarification on novolog rx.

## 2016-02-29 DIAGNOSIS — E119 Type 2 diabetes mellitus without complications: Secondary | ICD-10-CM | POA: Diagnosis not present

## 2016-02-29 LAB — HM DIABETES EYE EXAM

## 2016-03-01 ENCOUNTER — Telehealth: Payer: Self-pay

## 2016-03-01 MED ORDER — INSULIN ASPART 100 UNIT/ML FLEXPEN: PEN_INJECTOR | SUBCUTANEOUS | Status: DC

## 2016-03-01 NOTE — Telephone Encounter (Signed)
All set!

## 2016-03-01 NOTE — Telephone Encounter (Signed)
Rx for Novolog Flexpen  50-60units under skin TID  11 BOXES with 1 refill  This is for six months  Please enter in EPIC  - I called in verbally

## 2016-03-01 NOTE — Telephone Encounter (Signed)
Ask pt   Expand All Collapse All   Pharmacy called and would like clarification on novolog rx.

## 2016-03-10 DIAGNOSIS — J069 Acute upper respiratory infection, unspecified: Secondary | ICD-10-CM | POA: Diagnosis not present

## 2016-03-10 DIAGNOSIS — R05 Cough: Secondary | ICD-10-CM | POA: Diagnosis not present

## 2016-04-03 ENCOUNTER — Other Ambulatory Visit: Payer: Self-pay | Admitting: Family Medicine

## 2016-04-26 ENCOUNTER — Emergency Department
Admission: EM | Admit: 2016-04-26 | Discharge: 2016-04-27 | Disposition: A | Discharge status: Home / Self Care | Payer: PPO | Attending: Emergency Medicine | Admitting: Emergency Medicine

## 2016-04-26 ENCOUNTER — Emergency Department: Payer: PPO

## 2016-04-26 DIAGNOSIS — Z794 Long term (current) use of insulin: Secondary | ICD-10-CM | POA: Diagnosis not present

## 2016-04-26 DIAGNOSIS — E1122 Type 2 diabetes mellitus with diabetic chronic kidney disease: Secondary | ICD-10-CM | POA: Insufficient documentation

## 2016-04-26 DIAGNOSIS — E785 Hyperlipidemia, unspecified: Secondary | ICD-10-CM | POA: Diagnosis not present

## 2016-04-26 DIAGNOSIS — W1800XA Striking against unspecified object with subsequent fall, initial encounter: Secondary | ICD-10-CM | POA: Insufficient documentation

## 2016-04-26 DIAGNOSIS — E119 Type 2 diabetes mellitus without complications: Secondary | ICD-10-CM | POA: Diagnosis not present

## 2016-04-26 DIAGNOSIS — N183 Chronic kidney disease, stage 3 (moderate): Secondary | ICD-10-CM | POA: Diagnosis not present

## 2016-04-26 DIAGNOSIS — M1990 Unspecified osteoarthritis, unspecified site: Secondary | ICD-10-CM | POA: Insufficient documentation

## 2016-04-26 DIAGNOSIS — I129 Hypertensive chronic kidney disease with stage 1 through stage 4 chronic kidney disease, or unspecified chronic kidney disease: Secondary | ICD-10-CM | POA: Insufficient documentation

## 2016-04-26 DIAGNOSIS — Y999 Unspecified external cause status: Secondary | ICD-10-CM | POA: Diagnosis not present

## 2016-04-26 DIAGNOSIS — Y9289 Other specified places as the place of occurrence of the external cause: Secondary | ICD-10-CM | POA: Diagnosis not present

## 2016-04-26 DIAGNOSIS — Z79899 Other long term (current) drug therapy: Secondary | ICD-10-CM | POA: Diagnosis not present

## 2016-04-26 DIAGNOSIS — R0781 Pleurodynia: Secondary | ICD-10-CM | POA: Diagnosis not present

## 2016-04-26 DIAGNOSIS — Y939 Activity, unspecified: Secondary | ICD-10-CM | POA: Insufficient documentation

## 2016-04-26 DIAGNOSIS — S2242XA Multiple fractures of ribs, left side, initial encounter for closed fracture: Secondary | ICD-10-CM | POA: Diagnosis not present

## 2016-04-26 DIAGNOSIS — W19XXXA Unspecified fall, initial encounter: Secondary | ICD-10-CM

## 2016-04-26 DIAGNOSIS — F329 Major depressive disorder, single episode, unspecified: Secondary | ICD-10-CM | POA: Insufficient documentation

## 2016-04-26 DIAGNOSIS — Z87891 Personal history of nicotine dependence: Secondary | ICD-10-CM | POA: Insufficient documentation

## 2016-04-26 DIAGNOSIS — S299XXA Unspecified injury of thorax, initial encounter: Secondary | ICD-10-CM | POA: Diagnosis not present

## 2016-04-26 MED ORDER — TRAMADOL HCL 50 MG PO TABS
50.0000 mg | ORAL_TABLET | Freq: Once | ORAL | Status: AC
Start: 2016-04-26 — End: 2016-04-26
  Administered 2016-04-26: 50 mg via ORAL
  Filled 2016-04-26: qty 1

## 2016-04-26 MED ORDER — OXYCODONE-ACETAMINOPHEN 5-325 MG PO TABS: 1.0000 | ORAL_TABLET | Freq: Four times a day (QID) | ORAL | Status: DC | PRN

## 2016-04-26 NOTE — ED Provider Notes (Signed)
[facility] Emergency Department Provider Note   ____________________________________________  Time seen: Approximately 11:21 PM  I have reviewed the triage vital signs and the nursing notes.   HISTORY  Chief Complaint Fall    HPI MARCAL LIPPER is a 65 y.o. male who comes into the hospital today after falling. He reports that he was in the shower and he slipped. He fell and hit his left side on the tub. He reports that he fell backwards and rolled onto his left side. He hit the tub on the left side. He reports it hurts to take a deeper heavy breaths but he is able to take shallow breaths without difficulty. He feels very sensitive on that left side. He denies loss of consciousness and did not hit his head. He reports that the only complaint he has is left sided chest pain. He rates the pain a 4 out of 10 when he sitting still but reports that if he moves or coughs it goes up to an 8 or 9. The patient has no nausea no vomiting no diaphoresis no shortness of breath. He is here for evaluation.   Past Medical History  Diagnosis Date  . Diabetes mellitus without complication ([place])   . Depression   . Hyperlipidemia   . Chronic kidney disease   . Gout   . Hypertension   . GERD (gastroesophageal reflux disease)   . Arthritis     Patient Active Problem List   Diagnosis Date Noted  . CKD stage 3 due to type 2 diabetes mellitus ([place]) 02/18/2016  . Diabetes mellitus without complication ([place])   . Hyperlipidemia   . Hypertension     Past Surgical History  Procedure Laterality Date  . Appendectomy    . Cholecystectomy    . Spine surgery    . Carpal tunnel release Right     Current Outpatient Rx  Name  Route  Sig  Dispense  Refill  . amLODipine (NORVASC) 5 MG tablet   Oral   Take 1 tablet (5 mg total) by mouth daily.   90 tablet   1   . benazepril (LOTENSIN) 40 MG tablet      1 TABLET DAILY ORAL BE SURE AND STOP BENAZEPRIL/HCT !!!   90 tablet  3   . colchicine 0.6 MG tablet      TAKE 2 TABLETS BY MOUTH AT THE FIRST SIGN OF OUTBREAK, THEN 1 TABLET BY MOUTH 1 HOUR LATER   30 tablet   3   . Exenatide ER 2 MG PEN   Subcutaneous   Inject 2 mg into the skin once a week.   12 each   4   . fluticasone (FLONASE) 50 MCG/ACT nasal spray      INSTILL 2 SPRAYS INTO EACH NOSTRIL ONCE DAILY      11   . gabapentin (NEURONTIN) 300 MG capsule      1 CAPSULE THREE TIMES EACH DAY ORAL FOR LEGS      4   . gemfibrozil (LOPID) 600 MG tablet      1 TABLET TWICE EACH DAY ORAL      4   . insulin aspart (NOVOLOG FLEXPEN) 100 UNIT/ML FlexPen      INJECT 50-60 UNITS UNDER THE SKIN 3 TIMES A DAY   55 pen   1     11 box   . LEVEMIR FLEXTOUCH 100 UNIT/ML Pen      INJECT TWICE DAILY AS DIRECTED   5  pen   12     RESPOND ASAP FOR A 90 DAY SUPPLY, THE ONE SENT REC ...   . ONE TOUCH ULTRA TEST test strip      USE AS DIRECTED   100 each   12   . pantoprazole (PROTONIX) 40 MG tablet   Oral   Take 40 mg by mouth daily.      4   . ULORIC 40 MG tablet   Oral   Take 0.5 tablets (20 mg total) by mouth daily.   90 tablet   4     Dispense as written.   Marland Kitchen oxyCODONE-acetaminophen (ROXICET) 5-325 MG tablet   Oral   Take 1 tablet by mouth every 6 (six) hours as needed.   12 tablet   0     Allergies Crestor  Family History  Problem Relation Age of Onset  . Hypertension Mother   . Cancer Mother   . Cancer Father   . Heart attack Brother     Social History Social History  Substance Use Topics  . Smoking status: Former Smoker    Types: Cigarettes    Quit date: 07/12/1997  . Smokeless tobacco: Never Used  . Alcohol Use: No    Review of Systems Constitutional: No fever/chills Eyes: No visual changes. ENT: No sore throat. Cardiovascular: Left lateral chest pain. Respiratory: Denies shortness of breath. Gastrointestinal: No abdominal pain.  No nausea, no vomiting.  No diarrhea.  No constipation. Genitourinary:  Negative for dysuria. Musculoskeletal: Negative for back pain. Skin: Negative for rash. Neurological: Negative for headaches, focal weakness or numbness.  10-point ROS otherwise negative.  ____________________________________________   PHYSICAL EXAM:  VITAL SIGNS: ED Triage Vitals  Enc Vitals Group     BP 04/26/16 2147 181/79 mmHg     Pulse Rate 04/26/16 2147 85     Resp 04/26/16 2147 18     Temp 04/26/16 2147 97.6 F (36.4 C)     Temp Source 04/26/16 2147 Oral     SpO2 04/26/16 2147 93 %     Weight 04/26/16 2147 248 lb (112.492 kg)     Height 04/26/16 2147 6\' 1"  (1.854 m)     Head Cir --      Peak Flow --      Pain Score 04/26/16 2149 5     Pain Loc --      Pain Edu? --      Excl. in [place]? --     Constitutional: Alert and oriented. Well appearing and in Moderate distress. Eyes: Conjunctivae are normal. PERRL. EOMI. Head: Atraumatic. Nose: No congestion/rhinnorhea. Mouth/Throat: Mucous membranes are moist.  Oropharynx non-erythematous. Cardiovascular: Normal rate, regular rhythm. Grossly normal heart sounds.  Good peripheral circulation. Respiratory: Normal respiratory effort.  No retractions. Lungs CTAB. Left chest wall tenderness to palpation, left anterior tenderness to palpation Gastrointestinal: Soft and nontender. No distention. Positive bowel sounds Musculoskeletal: No lower extremity tenderness nor edema.   Neurologic:  Normal speech and language. Skin:  Skin is warm, dry and intact. No rash noted. Psychiatric: Mood and affect are normal.   ____________________________________________   LABS (all labs ordered are listed, but only abnormal results are displayed)  Labs Reviewed - No data to display ____________________________________________  EKG  None ____________________________________________  RADIOLOGY  Left rib x-ray: No evidence of active pulmonary disease, acute fractures of the anterior left eighth and ninth  ribs. ____________________________________________   PROCEDURES  Procedure(s) performed: None  Critical Care performed: No  ____________________________________________   INITIAL  IMPRESSION / ASSESSMENT AND PLAN / ED COURSE  Pertinent labs & imaging results that were available during my care of the patient were reviewed by me and considered in my medical decision making (see chart for details).  This is a 65 year old male who comes into the hospital today with some left-sided chest pain after a fall onto the tub. The patient appears to have some rib fractures. He does not have any significant bruises but he is tender to palpation. I will give the patient dose of tramadol and I explained to him that he will take multiple weeks to months before the fractures were healed. He understands. I also explained to him that he is at risk for developing pneumonia so if he has any fevers cough worsened symptoms he should return to the emergency department. The patient reports he'll follow-up with his primary care physician. The patient will be discharged home. ____________________________________________   FINAL CLINICAL IMPRESSION(S) / ED DIAGNOSES  Final diagnoses:  Multiple rib fractures, left, closed, initial encounter  Fall, initial encounter      NEW MEDICATIONS STARTED DURING THIS VISIT:  New Prescriptions   OXYCODONE-ACETAMINOPHEN (ROXICET) 5-325 MG TABLET    Take 1 tablet by mouth every 6 (six) hours as needed.     Note:  This document was prepared using Dragon voice recognition software and may include unintentional dictation errors.    Loney Hering, MD 04/27/16 [phone]

## 2016-04-26 NOTE — Discharge Instructions (Signed)
Please return with worsening symptoms, cough, fever or abdominal pain  Rib Fracture A rib fracture is a break or crack in one of the bones of the ribs. The ribs are a group of long, curved bones that wrap around your chest and attach to your spine. They protect your lungs and other organs in the chest cavity. A broken or cracked rib is often painful, but most do not cause other problems. Most rib fractures heal on their own over time. However, rib fractures can be more serious if multiple ribs are broken or if broken ribs move out of place and push against other structures. CAUSES   A direct blow to the chest. For example, this could happen during contact sports, a car accident, or a fall against a hard object.  Repetitive movements with high force, such as pitching a baseball or having severe coughing spells. SYMPTOMS   Pain when you breathe in or cough.  Pain when someone presses on the injured area. DIAGNOSIS  Your caregiver will perform a physical exam. Various imaging tests may be ordered to confirm the diagnosis and to look for related injuries. These tests may include a chest X-ray, computed tomography (CT), magnetic resonance imaging (MRI), or a bone scan. TREATMENT  Rib fractures usually heal on their own in 1-3 months. The longer healing period is often associated with a continued cough or other aggravating activities. During the healing period, pain control is very important. Medication is usually given to control pain. Hospitalization or surgery may be needed for more severe injuries, such as those in which multiple ribs are broken or the ribs have moved out of place.  HOME CARE INSTRUCTIONS   Avoid strenuous activity and any activities or movements that cause pain. Be careful during activities and avoid bumping the injured rib.  Gradually increase activity as directed by your caregiver.  Only take over-the-counter or prescription medications as directed by your caregiver. Do not  take other medications without asking your caregiver first.  Apply ice to the injured area for the first 1-2 days after you have been treated or as directed by your caregiver. Applying ice helps to reduce inflammation and pain.  Put ice in a plastic bag.  Place a towel between your skin and the bag.   Leave the ice on for 15-20 minutes at a time, every 2 hours while you are awake.  Perform deep breathing as directed by your caregiver. This will help prevent pneumonia, which is a common complication of a broken rib. Your caregiver may instruct you to:  Take deep breaths several times a day.  Try to cough several times a day, holding a pillow against the injured area.  Use a device called an incentive spirometer to practice deep breathing several times a day.  Drink enough fluids to keep your urine clear or pale yellow. This will help you avoid constipation.   Do not wear a rib belt or binder. These restrict breathing, which can lead to pneumonia.  SEEK IMMEDIATE MEDICAL CARE IF:   You have a fever.   You have difficulty breathing or shortness of breath.   You develop a continual cough, or you cough up thick or bloody sputum.  You feel sick to your stomach (nausea), throw up (vomit), or have abdominal pain.   You have worsening pain not controlled with medications.  MAKE SURE YOU:  Understand these instructions.  Will watch your condition.  Will get help right away if you are not doing well  or get worse.   This information is not intended to replace advice given to you by your health care provider. Make sure you discuss any questions you have with your health care provider.   Document Released: 11/28/2005 Document Revised: 07/31/2013 Document Reviewed: 01/30/2013 Elsevier Interactive Patient Education [employer].

## 2016-04-26 NOTE — ED Notes (Signed)
Pt in with co falling in the shower and hitting left posterior rib are on tub, co pain.

## 2016-04-27 NOTE — ED Notes (Signed)
Pt discharged during [facility] downtime.  See paper chart of discharge information.

## 2016-05-03 ENCOUNTER — Other Ambulatory Visit
Admission: RE | Admit: 2016-05-03 | Discharge: 2016-05-03 | Disposition: A | Discharge status: Home / Self Care | Payer: PPO | Source: Ambulatory Visit | Attending: Rheumatology | Admitting: Rheumatology

## 2016-05-03 DIAGNOSIS — M150 Primary generalized (osteo)arthritis: Secondary | ICD-10-CM | POA: Diagnosis not present

## 2016-05-03 DIAGNOSIS — M25561 Pain in right knee: Secondary | ICD-10-CM | POA: Diagnosis not present

## 2016-05-03 DIAGNOSIS — M1A00X0 Idiopathic chronic gout, unspecified site, without tophus (tophi): Secondary | ICD-10-CM | POA: Diagnosis not present

## 2016-05-03 LAB — SYNOVIAL CELL COUNT + DIFF, W/ CRYSTALS
Eosinophils-Synovial: 0 %
LYMPHOCYTES-SYNOVIAL FLD: 31 %
Monocyte-Macrophage-Synovial Fluid: 48 %
NEUTROPHIL, SYNOVIAL: 21 %
Other Cells-SYN: 0
WBC, SYNOVIAL: 692 /mm3 — AB (ref 0–200)

## 2016-05-07 ENCOUNTER — Other Ambulatory Visit: Payer: Self-pay | Admitting: Family Medicine

## 2016-05-10 ENCOUNTER — Other Ambulatory Visit: Payer: Self-pay | Admitting: Family Medicine

## 2016-05-10 ENCOUNTER — Ambulatory Visit (INDEPENDENT_AMBULATORY_CARE_PROVIDER_SITE_OTHER): Payer: PPO | Admitting: Family Medicine

## 2016-05-10 ENCOUNTER — Ambulatory Visit: Payer: PPO | Admitting: Family Medicine

## 2016-05-10 ENCOUNTER — Encounter: Payer: Self-pay | Admitting: Family Medicine

## 2016-05-10 VITALS — BP 122/75 | HR 91 | Temp 97.9°F | Ht 71.4 in | Wt 247.0 lb

## 2016-05-10 DIAGNOSIS — S2232XA Fracture of one rib, left side, initial encounter for closed fracture: Secondary | ICD-10-CM

## 2016-05-10 NOTE — Telephone Encounter (Signed)
Your patient 

## 2016-05-10 NOTE — Progress Notes (Signed)
   BP 122/75 mmHg  Pulse 91  Temp(Src) 97.9 F (36.6 C)  Ht 5' 11.4" (1.814 m)  Wt 247 lb (112.038 kg)  BMI 34.05 kg/m2  SpO2 97%   Subjective:    Patient ID: Jerry Pollard, male    DOB: 11-Jun-1951, 65 y.o.   MRN: [id number]  HPI: MEKKO HEYDE is a 65 y.o. male  Chief Complaint  Patient presents with  . follow up from fall   Patient fell May 16 in the bathtub broke 2 ribs treated and released from the emergency room was given Percocet for pain. Patient unable to work his works at [employer] doing heavy lifting. Patient needs insurance forms filled out an FMLA forms filled out Doing okay unable to cough the pain is a little better as long as he is still Not taking Percocet anymore. Has pain with motion especially of his left arm Relevant past medical, surgical, family and social history reviewed and updated as indicated. Interim medical history since our last visit reviewed. Allergies and medications reviewed and updated.  Review of Systems  Constitutional: Negative.   Respiratory: Negative.   Cardiovascular: Negative.     Per HPI unless specifically indicated above     Objective:    BP 122/75 mmHg  Pulse 91  Temp(Src) 97.9 F (36.6 C)  Ht 5' 11.4" (1.814 m)  Wt 247 lb (112.038 kg)  BMI 34.05 kg/m2  SpO2 97%  Wt Readings from Last 3 Encounters:  05/10/16 247 lb (112.038 kg)  04/26/16 248 lb (112.492 kg)  02/10/16 249 lb (112.946 kg)    Physical Exam  Constitutional: He is oriented to person, place, and time. He appears well-developed and well-nourished. No distress.  HENT:  Head: Normocephalic and atraumatic.  Right Ear: Hearing normal.  Left Ear: Hearing normal.  Nose: Nose normal.  Eyes: Conjunctivae and lids are normal. Right eye exhibits no discharge. Left eye exhibits no discharge. No scleral icterus.  Cardiovascular: Normal rate, regular rhythm and normal heart sounds.   Pulmonary/Chest: Effort normal and breath sounds normal. No respiratory distress.   Musculoskeletal: Normal range of motion.  Neurological: He is alert and oriented to person, place, and time.  Skin: Skin is intact. No rash noted.  Psychiatric: He has a normal mood and affect. His speech is normal and behavior is normal. Judgment and thought content normal. Cognition and memory are normal.    Results for orders placed or performed during the hospital encounter of 05/03/16  Synovial cell count + diff, w/ crystals  Result Value Ref Range   Color, Synovial AMBER (A) YELLOW   Appearance-Synovial CLOUDY (A) CLEAR   Crystals, Fluid EXTRACELLULAR MONOSODIUM URATE CRYSTALS    WBC, Synovial 692 (H) 0 - 200 /cu mm   Neutrophil, Synovial 21 %   Lymphocytes-Synovial Fld 31 %   Monocyte-Macrophage-Synovial Fluid 48 %   Eosinophils-Synovial 0 %   Other Cells-SYN 0       Assessment & Plan:   Problem List Items Addressed This Visit    None    Visit Diagnoses    Rib fracture, left, closed, initial encounter    -  Primary    Discuss rib fracture care and treatment expected out of work until mid-August gave note to help August 14 paperwork filled out        Follow up plan: Return in about 4 weeks (around 06/07/2016) for Routine diabetic check A1c etc..

## 2016-05-12 ENCOUNTER — Ambulatory Visit: Payer: PPO | Admitting: Family Medicine

## 2016-05-24 DIAGNOSIS — M25561 Pain in right knee: Secondary | ICD-10-CM | POA: Diagnosis not present

## 2016-05-24 DIAGNOSIS — M1712 Unilateral primary osteoarthritis, left knee: Secondary | ICD-10-CM | POA: Diagnosis not present

## 2016-05-24 DIAGNOSIS — M1A00X0 Idiopathic chronic gout, unspecified site, without tophus (tophi): Secondary | ICD-10-CM | POA: Diagnosis not present

## 2016-05-24 DIAGNOSIS — M1711 Unilateral primary osteoarthritis, right knee: Secondary | ICD-10-CM | POA: Diagnosis not present

## 2016-05-30 ENCOUNTER — Ambulatory Visit (INDEPENDENT_AMBULATORY_CARE_PROVIDER_SITE_OTHER): Payer: PPO | Admitting: Family Medicine

## 2016-05-30 ENCOUNTER — Encounter: Payer: Self-pay | Admitting: Family Medicine

## 2016-05-30 VITALS — BP 120/81 | HR 88 | Temp 98.0°F | Ht 72.1 in | Wt 249.0 lb

## 2016-05-30 DIAGNOSIS — N183 Chronic kidney disease, stage 3 unspecified: Secondary | ICD-10-CM

## 2016-05-30 DIAGNOSIS — E785 Hyperlipidemia, unspecified: Secondary | ICD-10-CM | POA: Diagnosis not present

## 2016-05-30 DIAGNOSIS — M109 Gout, unspecified: Secondary | ICD-10-CM | POA: Insufficient documentation

## 2016-05-30 DIAGNOSIS — E1142 Type 2 diabetes mellitus with diabetic polyneuropathy: Secondary | ICD-10-CM | POA: Diagnosis not present

## 2016-05-30 DIAGNOSIS — M10069 Idiopathic gout, unspecified knee: Secondary | ICD-10-CM | POA: Diagnosis not present

## 2016-05-30 DIAGNOSIS — Z0000 Encounter for general adult medical examination without abnormal findings: Secondary | ICD-10-CM | POA: Diagnosis not present

## 2016-05-30 DIAGNOSIS — Z23 Encounter for immunization: Secondary | ICD-10-CM | POA: Diagnosis not present

## 2016-05-30 DIAGNOSIS — E1122 Type 2 diabetes mellitus with diabetic chronic kidney disease: Secondary | ICD-10-CM

## 2016-05-30 LAB — URINALYSIS, ROUTINE W REFLEX MICROSCOPIC
Bilirubin, UA: NEGATIVE
GLUCOSE, UA: NEGATIVE
Ketones, UA: NEGATIVE
Leukocytes, UA: NEGATIVE
NITRITE UA: NEGATIVE
PH UA: 5 (ref 5.0–7.5)
RBC, UA: NEGATIVE
Specific Gravity, UA: 1.02 (ref 1.005–1.030)
UUROB: 0.2 mg/dL (ref 0.2–1.0)

## 2016-05-30 LAB — MICROSCOPIC EXAMINATION

## 2016-05-30 LAB — BAYER DCA HB A1C WAIVED: HB A1C: 7.7 % — AB (ref <7.0)

## 2016-05-30 MED ORDER — PANTOPRAZOLE SODIUM 40 MG PO TBEC: 40.0000 mg | DELAYED_RELEASE_TABLET | Freq: Every day | ORAL | Status: DC

## 2016-05-30 MED ORDER — GABAPENTIN 300 MG PO CAPS: 300.0000 mg | ORAL_CAPSULE | Freq: Two times a day (BID) | ORAL | Status: DC

## 2016-05-30 MED ORDER — FEBUXOSTAT 40 MG PO TABS: 20.0000 mg | ORAL_TABLET | Freq: Every day | ORAL | Status: DC

## 2016-05-30 MED ORDER — GEMFIBROZIL 600 MG PO TABS: 600.0000 mg | ORAL_TABLET | Freq: Two times a day (BID) | ORAL | Status: DC

## 2016-05-30 MED ORDER — COLCHICINE 0.6 MG PO TABS: 0.6000 mg | ORAL_TABLET | Freq: Every day | ORAL | Status: DC

## 2016-05-30 MED ORDER — INSULIN ASPART 100 UNIT/ML FLEXPEN: PEN_INJECTOR | SUBCUTANEOUS | Status: DC

## 2016-05-30 MED ORDER — AMLODIPINE BESYLATE 5 MG PO TABS: 5.0000 mg | ORAL_TABLET | Freq: Every day | ORAL | Status: DC

## 2016-05-30 MED ORDER — BENAZEPRIL HCL 40 MG PO TABS: 40.0000 mg | ORAL_TABLET | Freq: Every day | ORAL | Status: DC

## 2016-05-30 MED ORDER — INSULIN DETEMIR 100 UNIT/ML FLEXPEN: 50.0000 [IU] | PEN_INJECTOR | Freq: Every day | SUBCUTANEOUS | Status: DC

## 2016-05-30 NOTE — Assessment & Plan Note (Signed)
The current medical regimen is effective;  continue present plan and medications.  

## 2016-05-30 NOTE — Progress Notes (Signed)
BP 120/81 mmHg  Pulse 88  Temp(Src) 98 F (36.7 C)  Ht 6' 0.1" (1.831 m)  Wt 249 lb (112.946 kg)  BMI 33.69 kg/m2  SpO2 98%   Subjective:    Patient ID: Jerry Pollard, male    DOB: 1951-07-02, 65 y.o.   MRN: [id number]  HPI: Jerry Pollard is a 65 y.o. male  Chief Complaint  Patient presents with  . Welcome to [facility]  Patient also recheck medical problems blood pressure doing well with medications no side effects or issues Triglyceride stable on Lopid without problems Gout controlled with no gout flares takes Uloric 40 mg half a tablet a day Gabapentin 1-2 tablets at night controls diabetic peripheral neuropathy symptoms Insulin try to stretch insulin out generally takes 50 units of Levemir at that time blood sugars anywhere from 1 teens to 160s in the morning depending on what he eats at night.  Takes tramadol took during rib fracture which is largely healed now with only slight pain occasionally took previously for intermittent low back pain  Relevant past medical, surgical, family and social history reviewed and updated as indicated. Interim medical history since our last visit reviewed. Allergies and medications reviewed and updated.  Review of Systems  Constitutional: Negative.   HENT: Negative.   Eyes: Negative.   Respiratory: Negative.   Cardiovascular: Negative.   Gastrointestinal: Negative.   Endocrine: Negative.   Genitourinary: Negative.   Musculoskeletal: Negative.   Skin: Negative.   Allergic/Immunologic: Negative.   Neurological: Negative.   Hematological: Negative.   Psychiatric/Behavioral: Negative.     Per HPI unless specifically indicated above     Objective:    BP 120/81 mmHg  Pulse 88  Temp(Src) 98 F (36.7 C)  Ht 6' 0.1" (1.831 m)  Wt 249 lb (112.946 kg)  BMI 33.69 kg/m2  SpO2 98%  Wt Readings from Last 3 Encounters:  05/30/16 249 lb (112.946 kg)  05/10/16 247 lb (112.038 kg)  04/26/16 248 lb (112.492 kg)    Physical Exam   Constitutional: He is oriented to person, place, and time. He appears well-developed and well-nourished.  HENT:  Head: Normocephalic and atraumatic.  Right Ear: External ear normal.  Left Ear: External ear normal.  Eyes: Conjunctivae and EOM are normal. Pupils are equal, round, and reactive to light.  Neck: Normal range of motion. Neck supple.  Cardiovascular: Normal rate, regular rhythm, normal heart sounds and intact distal pulses.   Pulmonary/Chest: Effort normal and breath sounds normal.  Abdominal: Soft. Bowel sounds are normal. There is no splenomegaly or hepatomegaly.  Genitourinary: Rectum normal, prostate normal and penis normal.  Musculoskeletal: Normal range of motion.  Neurological: He is alert and oriented to person, place, and time. He has normal reflexes.  Skin: No rash noted. No erythema.  Psychiatric: He has a normal mood and affect. His behavior is normal. Judgment and thought content normal.    Results for orders placed or performed during the hospital encounter of 05/03/16  Synovial cell count + diff, w/ crystals  Result Value Ref Range   Color, Synovial AMBER (A) YELLOW   Appearance-Synovial CLOUDY (A) CLEAR   Crystals, Fluid EXTRACELLULAR MONOSODIUM URATE CRYSTALS    WBC, Synovial 692 (H) 0 - 200 /cu mm   Neutrophil, Synovial 21 %   Lymphocytes-Synovial Fld 31 %   Monocyte-Macrophage-Synovial Fluid 48 %   Eosinophils-Synovial 0 %   Other Cells-SYN 0       Assessment & Plan:   Problem List  Items Addressed This Visit      Endocrine   Type 2 diabetes mellitus with peripheral neuropathy (HCC)    The current medical regimen is effective;  continue present plan and medications.       Relevant Medications   insulin aspart (NOVOLOG FLEXPEN) 100 UNIT/ML FlexPen   Insulin Detemir (LEVEMIR FLEXTOUCH) 100 UNIT/ML Pen   gabapentin (NEURONTIN) 300 MG capsule   benazepril (LOTENSIN) 40 MG tablet   Other Relevant Orders   Comprehensive metabolic panel   Lipid  panel   CBC with Differential/Platelet   TSH   Urinalysis, Routine w reflex microscopic (not at [facility])     Genitourinary   CKD stage 3 due to type 2 diabetes mellitus ([place])    Discussed diabetes improved control but needing further control adjusting insulin and weight loss exercise nutrition      Relevant Medications   insulin aspart (NOVOLOG FLEXPEN) 100 UNIT/ML FlexPen   Insulin Detemir (LEVEMIR FLEXTOUCH) 100 UNIT/ML Pen   benazepril (LOTENSIN) 40 MG tablet     Other   Hyperlipidemia    The current medical regimen is effective;  continue present plan and medications.       Relevant Medications   gemfibrozil (LOPID) 600 MG tablet   amLODipine (NORVASC) 5 MG tablet   benazepril (LOTENSIN) 40 MG tablet   Other Relevant Orders   Comprehensive metabolic panel   Lipid panel   CBC with Differential/Platelet   TSH   Urinalysis, Routine w reflex microscopic (not at [facility])   Bayer DCA Hb A1c Waived   Gout    The current medical regimen is effective;  continue present plan and medications.       Relevant Orders   Uric acid    Other Visit Diagnoses    Need for Tdap vaccination    -  Primary    Relevant Orders    Tdap vaccine greater than or equal to 7yo IM (Completed)    Need for pneumococcal vaccination        Relevant Orders    Pneumococcal conjugate vaccine 13-valent IM (Completed)    PE (physical exam), annual        Relevant Orders    Comprehensive metabolic panel    Lipid panel    CBC with Differential/Platelet    TSH    Urinalysis, Routine w reflex microscopic (not at [facility])    PSA    Uric acid    Bayer DCA Hb A1c Waived       Welcome to [facility] met discuss Will and living well etc. Normal EKG Follow up plan: No Follow-up on file.

## 2016-05-30 NOTE — Patient Instructions (Signed)
Pneumococcal Conjugate Vaccine (PCV13)  1. Why get vaccinated? Vaccination can protect both children and adults from pneumococcal disease. Pneumococcal disease is caused by bacteria that can spread from person to person through close contact. It can cause ear infections, and it can also lead to more serious infections of the:  Lungs (pneumonia),  Blood (bacteremia), and  Covering of the brain and spinal cord (meningitis). Pneumococcal pneumonia is most common among adults. Pneumococcal meningitis can cause deafness and brain damage, and it kills about 1 child in 10 who get it. Anyone can get pneumococcal disease, but children under 28 years of age and adults 43 years and older, people with certain medical conditions, and cigarette smokers are at the highest risk. Before there was a vaccine, the [place] saw:  more than 700 cases of meningitis,  about 13,000 blood infections,  about 5 million ear infections, and  about 200 deaths in children under 5 each year from pneumococcal disease. Since vaccine became available, severe pneumococcal disease in these children has fallen by 88%. About 18,000 older adults die of pneumococcal disease each year in the [place]. Treatment of pneumococcal infections with penicillin and other drugs is not as effective as it used to be, because some strains of the disease have become resistant to these drugs. This makes prevention of the disease, through vaccination, even more important. 2. PCV13 vaccine Pneumococcal conjugate vaccine (called PCV13) protects against 13 types of pneumococcal bacteria. PCV13 is routinely given to children at 2, 4, 6, and 65-74 months of age. It is also recommended for children and adults 70 to 70 years of age with certain health conditions, and for all adults 64 years of age and older. Your doctor can give you details. 3. Some people should not get this vaccine Anyone who has ever had a life-threatening allergic reaction  to a dose of this vaccine, to an earlier pneumococcal vaccine called PCV7, or to any vaccine containing diphtheria toxoid (for example, DTaP), should not get PCV13. Anyone with a severe allergy to any component of PCV13 should not get the vaccine. Tell your doctor if the person being vaccinated has any severe allergies. If the person scheduled for vaccination is not feeling well, your healthcare provider might decide to reschedule the shot on another day. 4. Risks of a vaccine reaction With any medicine, including vaccines, there is a chance of reactions. These are usually mild and go away on their own, but serious reactions are also possible. Problems reported following PCV13 varied by age and dose in the series. The most common problems reported among children were:  About half became drowsy after the shot, had a temporary loss of appetite, or had redness or tenderness where the shot was given.  About 1 out of 3 had swelling where the shot was given.  About 1 out of 3 had a mild fever, and about 1 in 20 had a fever over 102.55F.  Up to about 8 out of 10 became fussy or irritable. Adults have reported pain, redness, and swelling where the shot was given; also mild fever, fatigue, headache, chills, or muscle pain. Young children who get PCV13 along with inactivated flu vaccine at the same time may be at increased risk for seizures caused by fever. Ask your doctor for more information. Problems that could happen after any vaccine:  People sometimes faint after a medical procedure, including vaccination. Sitting or lying down for about 15 minutes can help prevent fainting, and injuries caused by a fall.  Tell your doctor if you feel dizzy, or have vision changes or ringing in the ears.  Some older children and adults get severe pain in the shoulder and have difficulty moving the arm where a shot was given. This happens very rarely.  Any medication can cause a severe allergic reaction. Such  reactions from a vaccine are very rare, estimated at about 1 in a million doses, and would happen within a few minutes to a few hours after the vaccination. As with any medicine, there is a very small chance of a vaccine causing a serious injury or death. The safety of vaccines is always being monitored. For more information, visit: http://www.aguilar.org/ 5. What if there is a serious reaction? What should I look for?  Look for anything that concerns you, such as signs of a severe allergic reaction, very high fever, or unusual behavior. Signs of a severe allergic reaction can include hives, swelling of the face and throat, difficulty breathing, a fast heartbeat, dizziness, and weakness-usually within a few minutes to a few hours after the vaccination. What should I do?  If you think it is a severe allergic reaction or other emergency that can't wait, call 9-1-1 or get the person to the nearest hospital. Otherwise, call your doctor. Reactions should be reported to the Vaccine Adverse Event Reporting System (VAERS). Your doctor should file this report, or you can do it yourself through the VAERS web site at www.vaers.SamedayNews.es, or by calling (815) 318-9515. VAERS does not give medical advice. 6. The National Vaccine Injury Compensation Program The [employer] Vaccine Injury Compensation Program (VICP) is a federal program that was created to compensate people who may have been injured by certain vaccines. Persons who believe they may have been injured by a vaccine can learn about the program and about filing a claim by calling 458-779-5984 or visiting the [facility] website at GoldCloset.com.ee. There is a time limit to file a claim for compensation. 7. How can I learn more?  Ask your healthcare provider. He or she can give you the vaccine package insert or suggest other sources of information.  Call your local or state health department.  Contact the Centers for Disease Control and  Prevention (CDC):  Call 218-374-1675 (1-800-CDC-INFO) or  Visit CDC's website at http://hunter.com/ Vaccine Information Statement PCV13 Vaccine (10/16/2014)   This information is not intended to replace advice given to you by your health care provider. Make sure you discuss any questions you have with your health care provider.   Document Released: 09/25/2006 Document Revised: 12/19/2014 Document Reviewed: 10/23/2014 Elsevier Interactive Patient Education 2016 [employer]. Tdap Vaccine (Tetanus, Diphtheria and Pertussis): What You Need to Know 1. Why get vaccinated? Tetanus, diphtheria and pertussis are very serious diseases. Tdap vaccine can protect [place] from these diseases. And, Tdap vaccine given to pregnant women can protect newborn babies against pertussis. TETANUS (Lockjaw) is rare in the [place] today. It causes painful muscle tightening and stiffness, usually all over the body.  It can lead to tightening of muscles in the head and neck so you can't open your mouth, swallow, or sometimes even breathe. Tetanus kills about 1 out of 10 people who are infected even after receiving the best medical care. DIPHTHERIA is also rare in the [place] today. It can cause a thick coating to form in the back of the throat.  It can lead to breathing problems, heart failure, paralysis, and death. PERTUSSIS (Whooping Cough) causes severe coughing spells, which can cause difficulty breathing, vomiting and  disturbed sleep.  It can also lead to weight loss, incontinence, and rib fractures. Up to 2 in 100 adolescents and 5 in 100 adults with pertussis are hospitalized or have complications, which could include pneumonia or death. These diseases are caused by bacteria. Diphtheria and pertussis are spread from person to person through secretions from coughing or sneezing. Tetanus enters the body through cuts, scratches, or wounds. Before vaccines, as many as 200,000 cases of diphtheria,  200,000 cases of pertussis, and hundreds of cases of tetanus, were reported in the [place] each year. Since vaccination began, reports of cases for tetanus and diphtheria have dropped by about 99% and for pertussis by about 80%. 2. Tdap vaccine Tdap vaccine can protect adolescents and adults from tetanus, diphtheria, and pertussis. One dose of Tdap is routinely given at age 46 or 57. People who did not get Tdap at that age should get it as soon as possible. Tdap is especially important for healthcare professionals and anyone having close contact with a baby younger than 12 months. Pregnant women should get a dose of Tdap during every pregnancy, to protect the newborn from pertussis. Infants are most at risk for severe, life-threatening complications from pertussis. Another vaccine, called Td, protects against tetanus and diphtheria, but not pertussis. A Td booster should be given every 10 years. Tdap may be given as one of these boosters if you have never gotten Tdap before. Tdap may also be given after a severe cut or burn to prevent tetanus infection. Your doctor or the person giving you the vaccine can give you more information. Tdap may safely be given at the same time as other vaccines. 3. Some people should not get this vaccine  A person who has ever had a life-threatening allergic reaction after a previous dose of any diphtheria, tetanus or pertussis containing vaccine, OR has a severe allergy to any part of this vaccine, should not get Tdap vaccine. Tell the person giving the vaccine about any severe allergies.  Anyone who had coma or long repeated seizures within 7 days after a childhood dose of DTP or DTaP, or a previous dose of Tdap, should not get Tdap, unless a cause other than the vaccine was found. They can still get Td.  Talk to your doctor if you:  have seizures or another nervous system problem,  had severe pain or swelling after any vaccine containing diphtheria, tetanus or  pertussis,  ever had a condition called Guillain-Barr Syndrome (GBS),  aren't feeling well on the day the shot is scheduled. 4. Risks With any medicine, including vaccines, there is a chance of side effects. These are usually mild and go away on their own. Serious reactions are also possible but are rare. Most people who get Tdap vaccine do not have any problems with it. Mild problems following Tdap (Did not interfere with activities)  Pain where the shot was given (about 3 in 4 adolescents or 2 in 3 adults)  Redness or swelling where the shot was given (about 1 person in 5)  Mild fever of at least 100.72F (up to about 1 in 25 adolescents or 1 in 100 adults)  Headache (about 3 or 4 people in 10)  Tiredness (about 1 person in 3 or 4)  Nausea, vomiting, diarrhea, stomach ache (up to 1 in 4 adolescents or 1 in 10 adults)  Chills, sore joints (about 1 person in 10)  Body aches (about 1 person in 3 or 4)  Rash, swollen glands (uncommon) Moderate  problems following Tdap (Interfered with activities, but did not require medical attention)  Pain where the shot was given (up to 1 in 5 or 6)  Redness or swelling where the shot was given (up to about 1 in 16 adolescents or 1 in 12 adults)  Fever over 102F (about 1 in 100 adolescents or 1 in 250 adults)  Headache (about 1 in 7 adolescents or 1 in 10 adults)  Nausea, vomiting, diarrhea, stomach ache (up to 1 or 3 people in 100)  Swelling of the entire arm where the shot was given (up to about 1 in 500). Severe problems following Tdap (Unable to perform usual activities; required medical attention)  Swelling, severe pain, bleeding and redness in the arm where the shot was given (rare). Problems that could happen after any vaccine:  People sometimes faint after a medical procedure, including vaccination. Sitting or lying down for about 15 minutes can help prevent fainting, and injuries caused by a fall. Tell your doctor if you feel  dizzy, or have vision changes or ringing in the ears.  Some people get severe pain in the shoulder and have difficulty moving the arm where a shot was given. This happens very rarely.  Any medication can cause a severe allergic reaction. Such reactions from a vaccine are very rare, estimated at fewer than 1 in a million doses, and would happen within a few minutes to a few hours after the vaccination. As with any medicine, there is a very remote chance of a vaccine causing a serious injury or death. The safety of vaccines is always being monitored. For more information, visit: http://www.aguilar.org/ 5. What if there is a serious problem? What should I look for?  Look for anything that concerns you, such as signs of a severe allergic reaction, very high fever, or unusual behavior.  Signs of a severe allergic reaction can include hives, swelling of the face and throat, difficulty breathing, a fast heartbeat, dizziness, and weakness. These would usually start a few minutes to a few hours after the vaccination. What should I do?  If you think it is a severe allergic reaction or other emergency that can't wait, call 9-1-1 or get the person to the nearest hospital. Otherwise, call your doctor.  Afterward, the reaction should be reported to the Vaccine Adverse Event Reporting System (VAERS). Your doctor might file this report, or you can do it yourself through the VAERS web site at www.vaers.SamedayNews.es, or by calling 3464478192. VAERS does not give medical advice.  6. The National Vaccine Injury Compensation Program The [employer] Vaccine Injury Compensation Program (VICP) is a federal program that was created to compensate people who may have been injured by certain vaccines. Persons who believe they may have been injured by a vaccine can learn about the program and about filing a claim by calling (863) 373-7271 or visiting the [place] website at GoldCloset.com.ee. There is a time limit  to file a claim for compensation. 7. How can I learn more?  Ask your doctor. He or she can give you the vaccine package insert or suggest other sources of information.  Call your local or state health department.  Contact the Centers for Disease Control and Prevention (CDC):  Call (240) 750-4005 (1-800-CDC-INFO) or  Visit CDC's website at http://hunter.com/ CDC Tdap Vaccine VIS (02/04/14)   This information is not intended to replace advice given to you by your health care provider. Make sure you discuss any questions you have with your health care provider.   Document  Released: 05/29/2012 Document Revised: 12/19/2014 Document Reviewed: 03/12/2014 Elsevier Interactive Patient Education [employer].

## 2016-05-30 NOTE — Assessment & Plan Note (Signed)
Discussed diabetes improved control but needing further control adjusting insulin and weight loss exercise nutrition

## 2016-05-31 ENCOUNTER — Telehealth: Payer: Self-pay | Admitting: Family Medicine

## 2016-05-31 DIAGNOSIS — R972 Elevated prostate specific antigen [PSA]: Secondary | ICD-10-CM

## 2016-05-31 DIAGNOSIS — R748 Abnormal levels of other serum enzymes: Secondary | ICD-10-CM

## 2016-05-31 LAB — COMPREHENSIVE METABOLIC PANEL
A/G RATIO: 2.1 (ref 1.2–2.2)
ALT: 40 IU/L (ref 0–44)
AST: 21 IU/L (ref 0–40)
Albumin: 4.7 g/dL (ref 3.6–4.8)
Alkaline Phosphatase: 137 IU/L — ABNORMAL HIGH (ref 39–117)
BUN/Creatinine Ratio: 18 (ref 10–24)
BUN: 22 mg/dL (ref 8–27)
Bilirubin Total: 0.3 mg/dL (ref 0.0–1.2)
CALCIUM: 10.1 mg/dL (ref 8.6–10.2)
CO2: 23 mmol/L (ref 18–29)
Chloride: 99 mmol/L (ref 96–106)
Creatinine, Ser: 1.24 mg/dL (ref 0.76–1.27)
GFR calc Af Amer: 70 mL/min/{1.73_m2} (ref >59)
GFR, EST NON AFRICAN AMERICAN: 61 mL/min/{1.73_m2} (ref >59)
GLUCOSE: 141 mg/dL — AB (ref 65–99)
Globulin, Total: 2.2 g/dL (ref 1.5–4.5)
POTASSIUM: 4.2 mmol/L (ref 3.5–5.2)
Sodium: 142 mmol/L (ref 134–144)
Total Protein: 6.9 g/dL (ref 6.0–8.5)

## 2016-05-31 LAB — CBC WITH DIFFERENTIAL/PLATELET
Basophils Absolute: 0 10*3/uL (ref 0.0–0.2)
Basos: 1 %
EOS (ABSOLUTE): 0.2 10*3/uL (ref 0.0–0.4)
EOS: 4 %
HEMATOCRIT: 45 % (ref 37.5–51.0)
HEMOGLOBIN: 15 g/dL (ref 12.6–17.7)
Immature Grans (Abs): 0 10*3/uL (ref 0.0–0.1)
Immature Granulocytes: 0 %
LYMPHS ABS: 1.5 10*3/uL (ref 0.7–3.1)
Lymphs: 29 %
MCH: 29.8 pg (ref 26.6–33.0)
MCHC: 33.3 g/dL (ref 31.5–35.7)
MCV: 89 fL (ref 79–97)
MONOCYTES: 8 %
Monocytes Absolute: 0.4 10*3/uL (ref 0.1–0.9)
NEUTROS ABS: 2.8 10*3/uL (ref 1.4–7.0)
Neutrophils: 58 %
Platelets: 212 10*3/uL (ref 150–379)
RBC: 5.04 x10E6/uL (ref 4.14–5.80)
RDW: 14 % (ref 12.3–15.4)
WBC: 5 10*3/uL (ref 3.4–10.8)

## 2016-05-31 LAB — TSH: TSH: 2.89 u[IU]/mL (ref 0.450–4.500)

## 2016-05-31 LAB — LIPID PANEL
Chol/HDL Ratio: 6.2 ratio units — ABNORMAL HIGH (ref 0.0–5.0)
Cholesterol, Total: 229 mg/dL — ABNORMAL HIGH (ref 100–199)
HDL: 37 mg/dL — AB (ref >39)
LDL Calculated: 120 mg/dL — ABNORMAL HIGH (ref 0–99)
TRIGLYCERIDES: 360 mg/dL — AB (ref 0–149)
VLDL Cholesterol Cal: 72 mg/dL — ABNORMAL HIGH (ref 5–40)

## 2016-05-31 LAB — URIC ACID: Uric Acid: 6.8 mg/dL (ref 3.7–8.6)

## 2016-05-31 LAB — PSA: PROSTATE SPECIFIC AG, SERUM: 4.5 ng/mL — AB (ref 0.0–4.0)

## 2016-05-31 NOTE — Telephone Encounter (Signed)
Phone call Discussed with patient elevated PSA with patient no symptoms will repeat in August Also alkaline phosphatase elevated will repeat CMP in August

## 2016-06-24 ENCOUNTER — Other Ambulatory Visit: Payer: Self-pay | Admitting: Family Medicine

## 2016-06-29 ENCOUNTER — Other Ambulatory Visit: Payer: Self-pay | Admitting: Family Medicine

## 2016-07-12 ENCOUNTER — Telehealth: Payer: Self-pay | Admitting: Family Medicine

## 2016-07-12 NOTE — Telephone Encounter (Signed)
Note ready for pick up 

## 2016-07-12 NOTE — Telephone Encounter (Signed)
Pt called stated he needs a back to work note stating he can return to work @ 100 % on August 14,2017. Pt stated Dr. Jeananne Rama already has the form. Please call when ready for pick up. Pt would like to pick it up ASAP. Thanks.

## 2016-07-13 ENCOUNTER — Encounter: Payer: PPO | Admitting: Family Medicine

## 2016-07-25 DIAGNOSIS — M25561 Pain in right knee: Secondary | ICD-10-CM | POA: Diagnosis not present

## 2016-07-25 DIAGNOSIS — M1A00X0 Idiopathic chronic gout, unspecified site, without tophus (tophi): Secondary | ICD-10-CM | POA: Diagnosis not present

## 2016-07-25 DIAGNOSIS — M150 Primary generalized (osteo)arthritis: Secondary | ICD-10-CM | POA: Diagnosis not present

## 2016-08-04 ENCOUNTER — Other Ambulatory Visit: Payer: PPO

## 2016-08-04 DIAGNOSIS — R748 Abnormal levels of other serum enzymes: Secondary | ICD-10-CM | POA: Diagnosis not present

## 2016-08-04 DIAGNOSIS — R972 Elevated prostate specific antigen [PSA]: Secondary | ICD-10-CM | POA: Diagnosis not present

## 2016-08-05 LAB — COMPREHENSIVE METABOLIC PANEL
ALT: 22 IU/L (ref 0–44)
AST: 19 IU/L (ref 0–40)
Albumin/Globulin Ratio: 2 (ref 1.2–2.2)
Albumin: 4.2 g/dL (ref 3.6–4.8)
Alkaline Phosphatase: 100 IU/L (ref 39–117)
BILIRUBIN TOTAL: 0.3 mg/dL (ref 0.0–1.2)
BUN/Creatinine Ratio: 20 (ref 10–24)
BUN: 26 mg/dL (ref 8–27)
CHLORIDE: 101 mmol/L (ref 96–106)
CO2: 20 mmol/L (ref 18–29)
Calcium: 9.2 mg/dL (ref 8.6–10.2)
Creatinine, Ser: 1.28 mg/dL — ABNORMAL HIGH (ref 0.76–1.27)
GFR calc non Af Amer: 58 mL/min/{1.73_m2} — ABNORMAL LOW (ref >59)
GFR, EST AFRICAN AMERICAN: 67 mL/min/{1.73_m2} (ref >59)
GLUCOSE: 142 mg/dL — AB (ref 65–99)
Globulin, Total: 2.1 g/dL (ref 1.5–4.5)
Potassium: 4.4 mmol/L (ref 3.5–5.2)
Sodium: 141 mmol/L (ref 134–144)
TOTAL PROTEIN: 6.3 g/dL (ref 6.0–8.5)

## 2016-08-05 LAB — PSA: PROSTATE SPECIFIC AG, SERUM: 2.4 ng/mL (ref 0.0–4.0)

## 2016-08-08 ENCOUNTER — Telehealth: Payer: Self-pay | Admitting: Family Medicine

## 2016-08-08 NOTE — Telephone Encounter (Signed)
Please call patient and let him know that his PSA and alkaline phosphatase have both improved. Thanks!

## 2016-08-25 DIAGNOSIS — M1A00X0 Idiopathic chronic gout, unspecified site, without tophus (tophi): Secondary | ICD-10-CM | POA: Diagnosis not present

## 2016-08-30 ENCOUNTER — Encounter: Payer: Self-pay | Admitting: Family Medicine

## 2016-08-30 ENCOUNTER — Other Ambulatory Visit: Payer: Self-pay

## 2016-08-30 ENCOUNTER — Ambulatory Visit (INDEPENDENT_AMBULATORY_CARE_PROVIDER_SITE_OTHER): Payer: PPO | Admitting: Family Medicine

## 2016-08-30 VITALS — BP 136/78 | HR 82 | Temp 98.0°F | Ht 73.0 in | Wt 255.0 lb

## 2016-08-30 DIAGNOSIS — E785 Hyperlipidemia, unspecified: Secondary | ICD-10-CM

## 2016-08-30 DIAGNOSIS — I10 Essential (primary) hypertension: Secondary | ICD-10-CM | POA: Diagnosis not present

## 2016-08-30 DIAGNOSIS — M10069 Idiopathic gout, unspecified knee: Secondary | ICD-10-CM | POA: Diagnosis not present

## 2016-08-30 DIAGNOSIS — M109 Gout, unspecified: Secondary | ICD-10-CM

## 2016-08-30 DIAGNOSIS — E1142 Type 2 diabetes mellitus with diabetic polyneuropathy: Secondary | ICD-10-CM

## 2016-08-30 LAB — MICROALBUMIN, URINE WAIVED
CREATININE, URINE WAIVED: 300 mg/dL (ref 10–300)
MICROALB, UR WAIVED: 80 mg/L — AB (ref 0–19)

## 2016-08-30 LAB — BAYER DCA HB A1C WAIVED: HB A1C (BAYER DCA - WAIVED): 8 % — ABNORMAL HIGH (ref <7.0)

## 2016-08-30 NOTE — Assessment & Plan Note (Signed)
The current medical regimen is effective;  continue present plan and medications.  

## 2016-08-30 NOTE — Progress Notes (Signed)
BP 136/78 (BP Location: Left Arm)   Pulse 82   Temp 98 F (36.7 C)   Ht 6\' 1"  (1.854 m)   Wt 255 lb (115.7 kg)   SpO2 99%   BMI 33.64 kg/m    Subjective:    Patient ID: Jerry Pollard, male    DOB: 06-14-1951, 65 y.o.   MRN: [id number]  HPI: Jerry Pollard is a 65 y.o. male  Chief Complaint  Patient presents with  . Follow-up   Patient doing well noted low blood sugar spells had episodes of been on prednisone for at least 2 weeks due to gout. Gout on high-dose Uloric is doing much better and uric acid is coming down nicely. Blood sugars fasting 140s with eating early and higher in the mid 100s to higher 100s when eats later in the evening. Also on prednisone and this summer gained some weight Blood pressures been doing well no issues with cholesterol. Gabapentin helps with leg pain  Relevant past medical, surgical, family and social history reviewed and updated as indicated. Interim medical history since our last visit reviewed. Allergies and medications reviewed and updated.  Review of Systems  Constitutional: Negative.   Respiratory: Negative.   Cardiovascular: Negative.     Per HPI unless specifically indicated above     Objective:    BP 136/78 (BP Location: Left Arm)   Pulse 82   Temp 98 F (36.7 C)   Ht 6\' 1"  (1.854 m)   Wt 255 lb (115.7 kg)   SpO2 99%   BMI 33.64 kg/m   Wt Readings from Last 3 Encounters:  08/30/16 255 lb (115.7 kg)  05/30/16 249 lb (112.9 kg)  05/10/16 247 lb (112 kg)    Physical Exam  Constitutional: He is oriented to person, place, and time. He appears well-developed and well-nourished. No distress.  HENT:  Head: Normocephalic and atraumatic.  Right Ear: Hearing normal.  Left Ear: Hearing normal.  Nose: Nose normal.  Eyes: Conjunctivae and lids are normal. Right eye exhibits no discharge. Left eye exhibits no discharge. No scleral icterus.  Cardiovascular: Normal rate, regular rhythm and normal heart sounds.   Pulmonary/Chest:  Effort normal and breath sounds normal. No respiratory distress.  Musculoskeletal: Normal range of motion.  Neurological: He is alert and oriented to person, place, and time.  Skin: Skin is intact. No rash noted.  Psychiatric: He has a normal mood and affect. His speech is normal and behavior is normal. Judgment and thought content normal. Cognition and memory are normal.    Results for orders placed or performed in visit on 08/04/16  Comprehensive metabolic panel  Result Value Ref Range   Glucose 142 (H) 65 - 99 mg/dL   BUN 26 8 - 27 mg/dL   Creatinine, Ser 1.28 (H) 0.76 - 1.27 mg/dL   GFR calc non Af Amer 58 (L) >59 mL/min/1.73   GFR calc Af Amer 67 >59 mL/min/1.73   BUN/Creatinine Ratio 20 10 - 24   Sodium 141 134 - 144 mmol/L   Potassium 4.4 3.5 - 5.2 mmol/L   Chloride 101 96 - 106 mmol/L   CO2 20 18 - 29 mmol/L   Calcium 9.2 8.6 - 10.2 mg/dL   Total Protein 6.3 6.0 - 8.5 g/dL   Albumin 4.2 3.6 - 4.8 g/dL   Globulin, Total 2.1 1.5 - 4.5 g/dL   Albumin/Globulin Ratio 2.0 1.2 - 2.2   Bilirubin Total 0.3 0.0 - 1.2 mg/dL   Alkaline Phosphatase 100  39 - 117 IU/L   AST 19 0 - 40 IU/L   ALT 22 0 - 44 IU/L  PSA  Result Value Ref Range   Prostate Specific Ag, Serum 2.4 0.0 - 4.0 ng/mL      Assessment & Plan:   Problem List Items Addressed This Visit      Cardiovascular and Mediastinum   Essential hypertension    The current medical regimen is effective;  continue present plan and medications.         Endocrine   Type 2 diabetes mellitus with peripheral neuropathy (HCC)    Discussed better control of diabetes adjusting insulin weight loss lifestyle measures Also mentioned endocrinology referral if not making progress next visit        Other   Hyperlipidemia    The current medical regimen is effective;  continue present plan and medications.       Gout    The current medical regimen is effective;  continue present plan and medications.        Other Visit  Diagnoses    Diabetic polyneuropathy associated with type 2 diabetes mellitus ([place])    -  Primary   Relevant Orders   Bayer DCA Hb A1c Waived   Microalbumin, Urine Waived       Follow up plan: Return in about 3 months (around 11/29/2016) for BMP,  Lipids, ALT, AST, Hemoglobin A1c.

## 2016-08-30 NOTE — Assessment & Plan Note (Signed)
Discussed better control of diabetes adjusting insulin weight loss lifestyle measures Also mentioned endocrinology referral if not making progress next visit

## 2016-09-29 DIAGNOSIS — M1A00X0 Idiopathic chronic gout, unspecified site, without tophus (tophi): Secondary | ICD-10-CM | POA: Diagnosis not present

## 2016-10-20 ENCOUNTER — Ambulatory Visit: Payer: PPO | Admitting: Family Medicine

## 2016-11-16 IMAGING — CR DG RIBS W/ CHEST 3+V*L*
5 series · 5 of 5 positions shown · non-contrast
Comparison: None.

CLINICAL DATA: Left posterior rib pain after fall on to bathtub.

EXAM:
LEFT RIBS AND CHEST - 3+ VIEW

[chest pa]
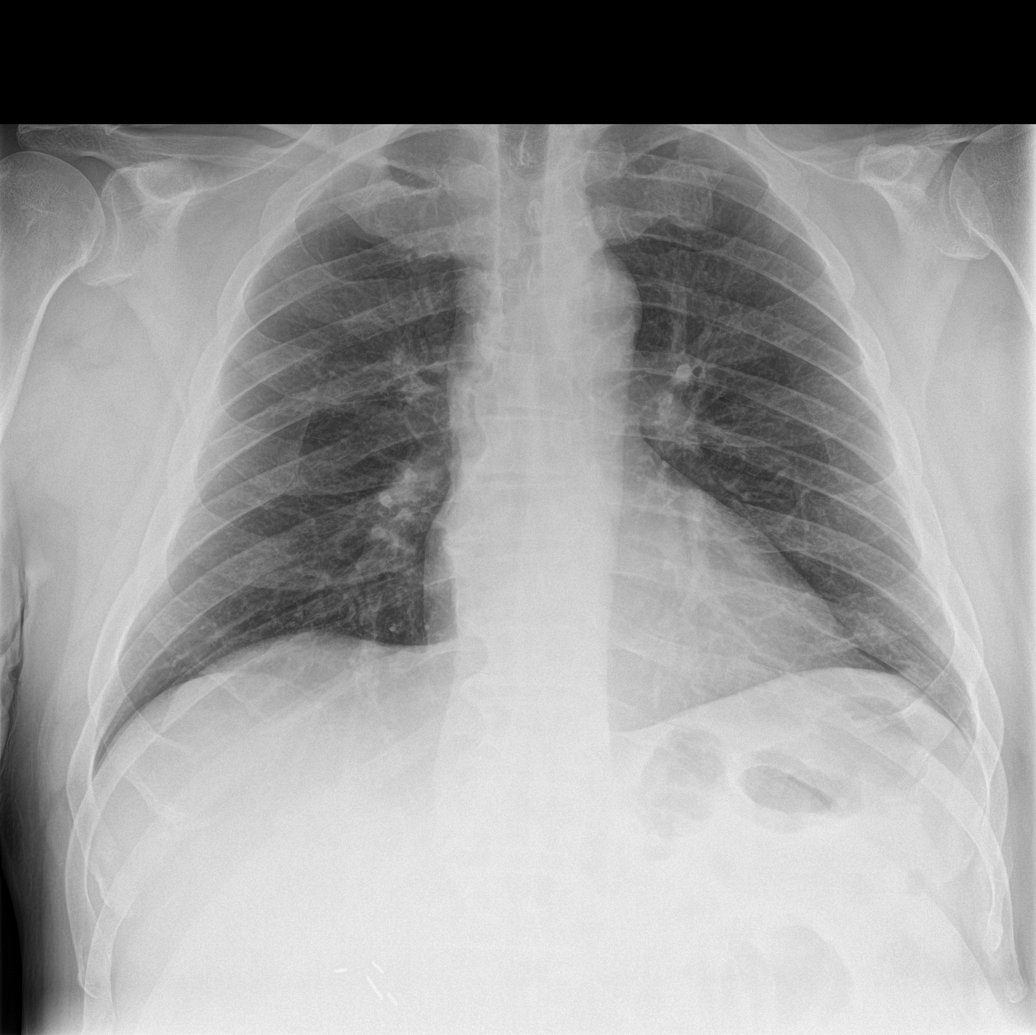

[rib pa]
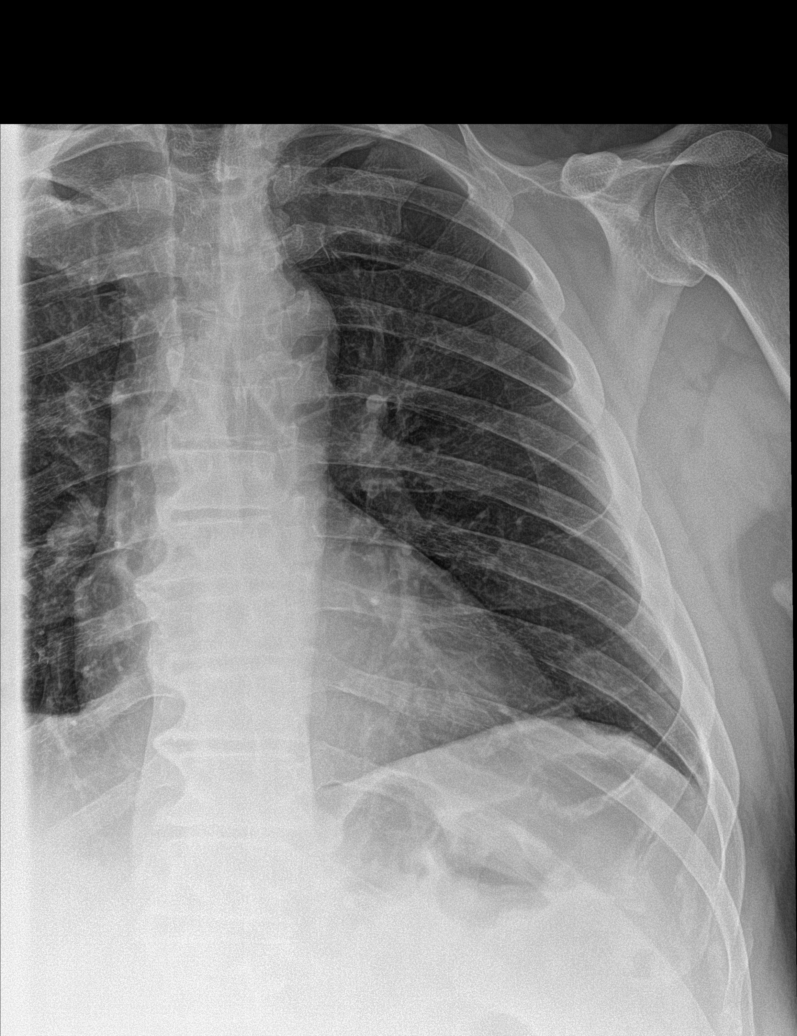

[rib pa obl]
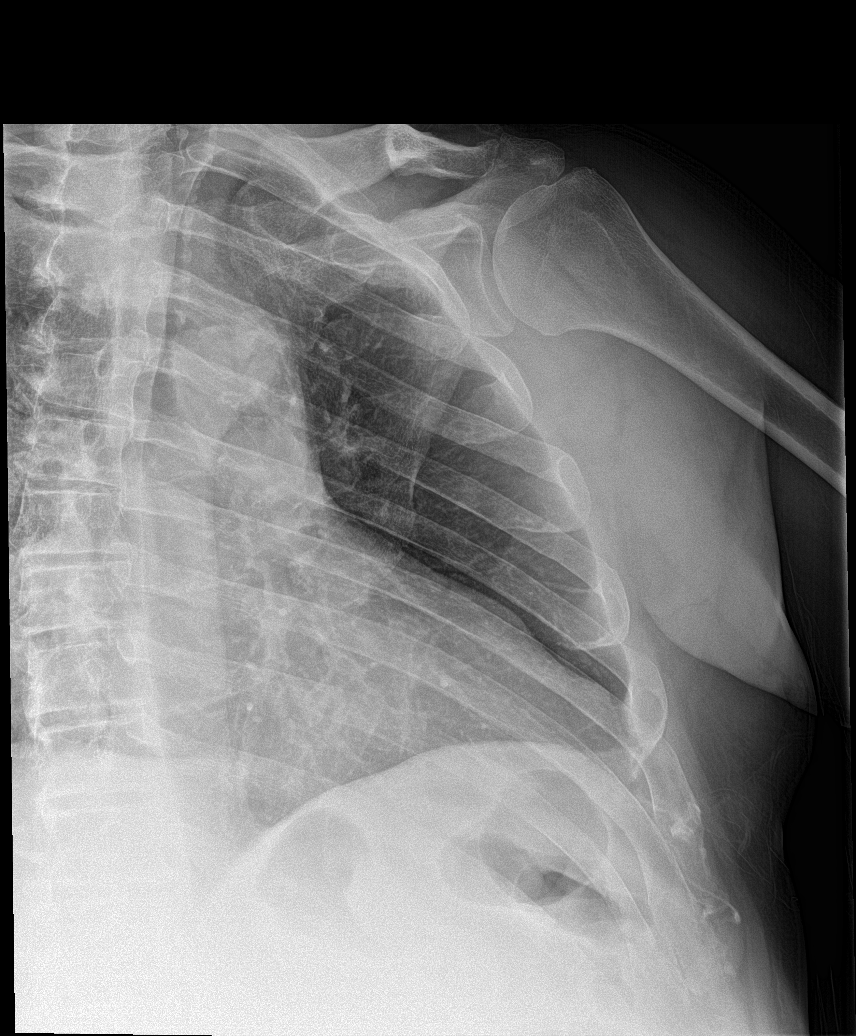

[rib ap]
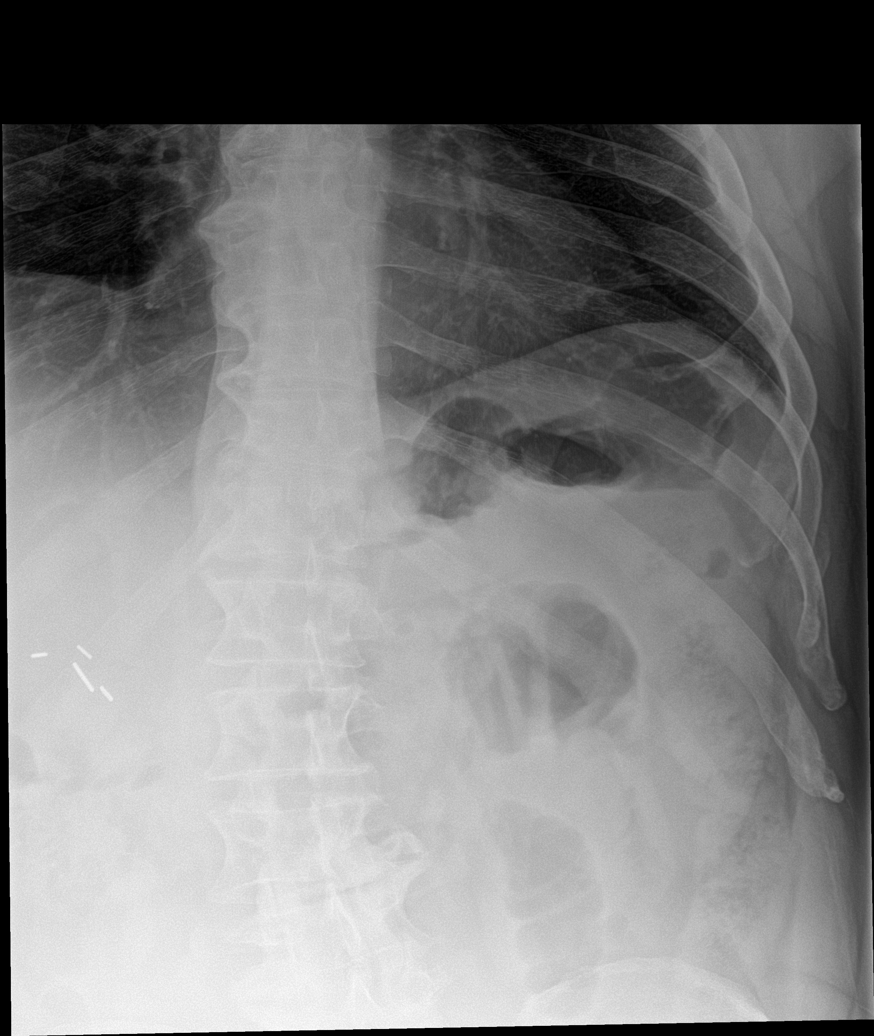

[rib ap obl]
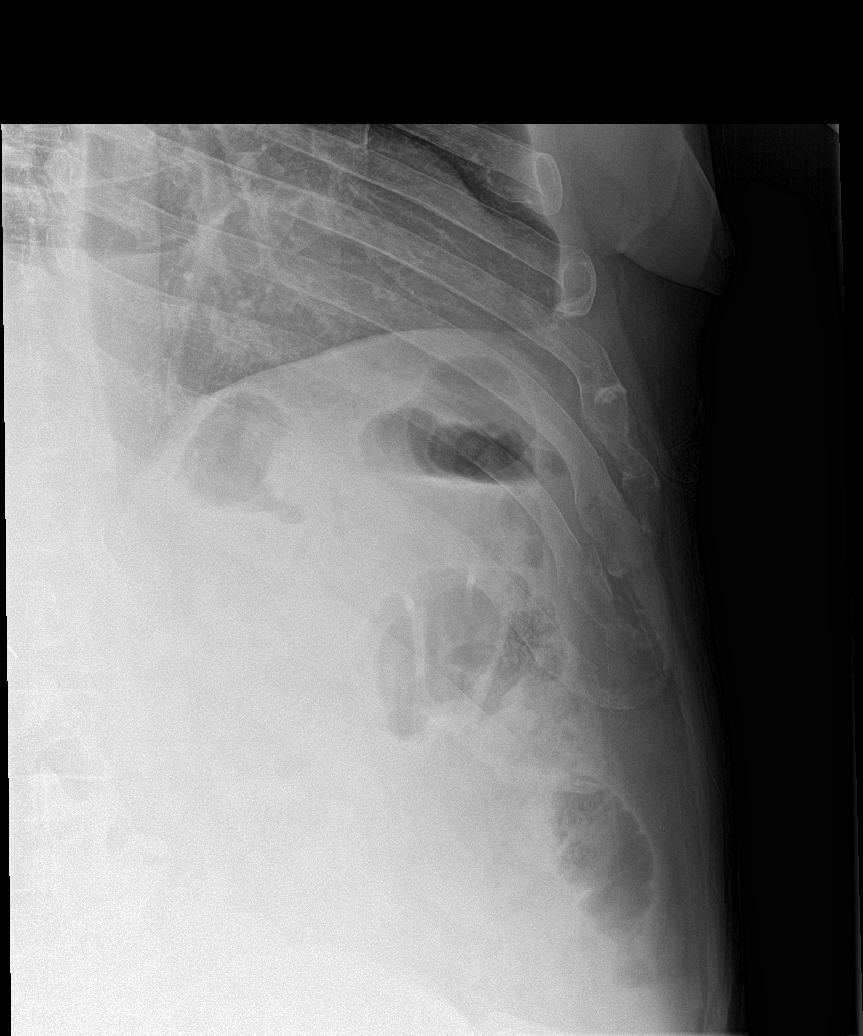

[5 of 5 positions shown; findings below may reference images not displayed]

FINDINGS: Normal heart size and pulmonary vascularity. No focal airspace
disease or consolidation in the lungs. No blunting of costophrenic
angles. No pneumothorax. Mediastinal contours appear intact.
Degenerative changes in the spine.

Acute minimally displaced fractures are demonstrated in the anterior
left eighth and ninth ribs. Old appearing fracture deformity
demonstrated in the left anterior seventh rib. No destructive bone
lesions. Surgical clips in the right upper quadrant.
IMPRESSION: No evidence of active pulmonary disease. Acute fractures of the
anterior left eighth and ninth ribs.

## 2016-11-29 ENCOUNTER — Encounter: Payer: Self-pay | Admitting: Family Medicine

## 2016-11-29 ENCOUNTER — Ambulatory Visit (INDEPENDENT_AMBULATORY_CARE_PROVIDER_SITE_OTHER): Payer: PPO | Admitting: Family Medicine

## 2016-11-29 VITALS — BP 127/76 | HR 82 | Temp 97.6°F | Wt 253.0 lb

## 2016-11-29 DIAGNOSIS — I10 Essential (primary) hypertension: Secondary | ICD-10-CM | POA: Diagnosis not present

## 2016-11-29 DIAGNOSIS — E1142 Type 2 diabetes mellitus with diabetic polyneuropathy: Secondary | ICD-10-CM | POA: Diagnosis not present

## 2016-11-29 DIAGNOSIS — M109 Gout, unspecified: Secondary | ICD-10-CM | POA: Diagnosis not present

## 2016-11-29 DIAGNOSIS — E785 Hyperlipidemia, unspecified: Secondary | ICD-10-CM | POA: Diagnosis not present

## 2016-11-29 LAB — LP+ALT+AST PICCOLO, WAIVED
ALT (SGPT) Piccolo, Waived: 20 U/L (ref 10–47)
AST (SGOT) Piccolo, Waived: 23 U/L (ref 11–38)
CHOL/HDL RATIO PICCOLO,WAIVE: 3.8 mg/dL
CHOLESTEROL PICCOLO, WAIVED: 188 mg/dL (ref <200)
HDL CHOL PICCOLO, WAIVED: 49 mg/dL — AB (ref >59)
LDL CHOL CALC PICCOLO WAIVED: 95 mg/dL (ref <100)
Triglycerides Piccolo,Waived: 217 mg/dL — ABNORMAL HIGH (ref <150)
VLDL Chol Calc Piccolo,Waive: 43 mg/dL — ABNORMAL HIGH (ref <30)

## 2016-11-29 LAB — BAYER DCA HB A1C WAIVED: HB A1C (BAYER DCA - WAIVED): 8.5 % — ABNORMAL HIGH (ref <7.0)

## 2016-11-29 NOTE — Assessment & Plan Note (Signed)
The current medical regimen is effective;  continue present plan and medications.  

## 2016-11-29 NOTE — Assessment & Plan Note (Signed)
Discussed cholesterol still poor control need to get LDL less than 70 again dietary efforts.

## 2016-11-29 NOTE — Progress Notes (Signed)
BP 127/76 (BP Location: Left Arm, Patient Position: Sitting, Cuff Size: Large)   Pulse 82   Temp 97.6 F (36.4 C)   Wt 253 lb (114.8 kg)   SpO2 95%   BMI 33.38 kg/m    Subjective:    Patient ID: Jerry Pollard, male    DOB: 1951/10/30, 65 y.o.   MRN: [id number]  HPI: Jerry Pollard is a 65 y.o. male  Chief Complaint  Patient presents with  . Diabetes  . Hyperlipidemia   Patient all in all doing well diabetes concerned may be out of guaiac is been having arthritis in his right knee is pending some knee surgery at some point but trying to avoid that has been on prednisone 10 mg which is made his sugars of course be elevated. Gout's doing okay on taking Uloric 80 alternating 40 with on review low uric acid levels. Patient still having a couple of twinges but all in all doing okay. No low blood sugar spells or issues But does occasionally like today happening breakfast or lunch and had a small Snickers bar on the way here is he is getting low blood sugars. Blood pressure doing well even on the prednisone no issues with medications.  Relevant past medical, surgical, family and social history reviewed and updated as indicated. Interim medical history since our last visit reviewed. Allergies and medications reviewed and updated.  Review of Systems  Constitutional: Negative.   Respiratory: Negative.   Cardiovascular: Negative.     Per HPI unless specifically indicated above     Objective:    BP 127/76 (BP Location: Left Arm, Patient Position: Sitting, Cuff Size: Large)   Pulse 82   Temp 97.6 F (36.4 C)   Wt 253 lb (114.8 kg)   SpO2 95%   BMI 33.38 kg/m   Wt Readings from Last 3 Encounters:  11/29/16 253 lb (114.8 kg)  08/30/16 255 lb (115.7 kg)  05/30/16 249 lb (112.9 kg)    Physical Exam  Constitutional: He is oriented to person, place, and time. He appears well-developed and well-nourished. No distress.  HENT:  Head: Normocephalic and atraumatic.  Right Ear: Hearing  normal.  Left Ear: Hearing normal.  Nose: Nose normal.  Eyes: Conjunctivae and lids are normal. Right eye exhibits no discharge. Left eye exhibits no discharge. No scleral icterus.  Cardiovascular: Normal rate, regular rhythm and normal heart sounds.   Pulmonary/Chest: Effort normal and breath sounds normal. No respiratory distress.  Musculoskeletal: Normal range of motion.  Neurological: He is alert and oriented to person, place, and time.  Skin: Skin is intact. No rash noted.  Psychiatric: He has a normal mood and affect. His speech is normal and behavior is normal. Judgment and thought content normal. Cognition and memory are normal.    Results for orders placed or performed in visit on 08/30/16  Bayer DCA Hb A1c Waived  Result Value Ref Range   Bayer DCA Hb A1c Waived 8.0 (H) <7.0 %  Microalbumin, Urine Waived  Result Value Ref Range   Microalb, Ur Waived 80 (H) 0 - 19 mg/L   Creatinine, Urine Waived 300 10 - 300 mg/dL   Microalb/Creat Ratio 30-300 (H) <30 mg/g      Assessment & Plan:   Problem List Items Addressed This Visit      Cardiovascular and Mediastinum   Essential hypertension    The current medical regimen is effective;  continue present plan and medications.       Relevant  Orders   Basic metabolic panel     Endocrine   Type 2 diabetes mellitus with peripheral neuropathy (HCC)    Poor control of diabetes discuss diet exercise nutrition weight loss increasing slightly insulin. Decreasing diet and if not better next visit will strongly consider endocrinology referral        Other   Hyperlipidemia    Discussed cholesterol still poor control need to get LDL less than 70 again dietary efforts.      Relevant Orders   LP+ALT+AST Piccolo, Waived   Gout    The current medical regimen is effective;  continue present plan and medications.        Other Visit Diagnoses    Diabetic polyneuropathy associated with type 2 diabetes mellitus ([place])    -  Primary    Relevant Orders   Bayer DCA Hb A1c Waived       Follow up plan: Return in about 3 months (around 02/27/2017) for Hemoglobin A1c.

## 2016-11-29 NOTE — Assessment & Plan Note (Signed)
Poor control of diabetes discuss diet exercise nutrition weight loss increasing slightly insulin. Decreasing diet and if not better next visit will strongly consider endocrinology referral

## 2016-11-30 ENCOUNTER — Encounter: Payer: Self-pay | Admitting: Family Medicine

## 2016-11-30 LAB — BASIC METABOLIC PANEL
BUN / CREAT RATIO: 20 (ref 10–24)
BUN: 26 mg/dL (ref 8–27)
CALCIUM: 9.3 mg/dL (ref 8.6–10.2)
CHLORIDE: 100 mmol/L (ref 96–106)
CO2: 24 mmol/L (ref 18–29)
Creatinine, Ser: 1.28 mg/dL — ABNORMAL HIGH (ref 0.76–1.27)
GFR, EST AFRICAN AMERICAN: 67 mL/min/{1.73_m2} (ref >59)
GFR, EST NON AFRICAN AMERICAN: 58 mL/min/{1.73_m2} — AB (ref >59)
Glucose: 81 mg/dL (ref 65–99)
POTASSIUM: 3.5 mmol/L (ref 3.5–5.2)
SODIUM: 144 mmol/L (ref 134–144)

## 2016-12-18 ENCOUNTER — Other Ambulatory Visit: Payer: Self-pay | Admitting: Family Medicine

## 2016-12-22 ENCOUNTER — Other Ambulatory Visit: Payer: Self-pay | Admitting: Family Medicine

## 2017-02-27 DIAGNOSIS — M1A00X0 Idiopathic chronic gout, unspecified site, without tophus (tophi): Secondary | ICD-10-CM | POA: Diagnosis not present

## 2017-02-27 DIAGNOSIS — M25561 Pain in right knee: Secondary | ICD-10-CM | POA: Diagnosis not present

## 2017-02-27 DIAGNOSIS — M150 Primary generalized (osteo)arthritis: Secondary | ICD-10-CM | POA: Diagnosis not present

## 2017-02-28 ENCOUNTER — Ambulatory Visit: Payer: PPO | Admitting: Family Medicine

## 2017-03-14 DIAGNOSIS — H2513 Age-related nuclear cataract, bilateral: Secondary | ICD-10-CM | POA: Diagnosis not present

## 2017-03-14 DIAGNOSIS — E119 Type 2 diabetes mellitus without complications: Secondary | ICD-10-CM | POA: Diagnosis not present

## 2017-03-16 ENCOUNTER — Encounter: Payer: Self-pay | Admitting: Family Medicine

## 2017-03-16 ENCOUNTER — Ambulatory Visit (INDEPENDENT_AMBULATORY_CARE_PROVIDER_SITE_OTHER): Payer: PPO | Admitting: Family Medicine

## 2017-03-16 VITALS — BP 152/89 | HR 93 | Wt 256.2 lb

## 2017-03-16 DIAGNOSIS — N183 Chronic kidney disease, stage 3 (moderate): Secondary | ICD-10-CM

## 2017-03-16 DIAGNOSIS — I10 Essential (primary) hypertension: Secondary | ICD-10-CM | POA: Diagnosis not present

## 2017-03-16 DIAGNOSIS — E1142 Type 2 diabetes mellitus with diabetic polyneuropathy: Secondary | ICD-10-CM | POA: Diagnosis not present

## 2017-03-16 DIAGNOSIS — E1122 Type 2 diabetes mellitus with diabetic chronic kidney disease: Secondary | ICD-10-CM

## 2017-03-16 DIAGNOSIS — M109 Gout, unspecified: Secondary | ICD-10-CM

## 2017-03-16 DIAGNOSIS — E785 Hyperlipidemia, unspecified: Secondary | ICD-10-CM

## 2017-03-16 LAB — LP+ALT+AST PICCOLO, WAIVED
ALT (SGPT) PICCOLO, WAIVED: 28 U/L (ref 10–47)
AST (SGOT) Piccolo, Waived: 21 U/L (ref 11–38)
Chol/HDL Ratio Piccolo,Waive: 4.8 mg/dL
Cholesterol Piccolo, Waived: 219 mg/dL — ABNORMAL HIGH (ref <200)
HDL CHOL PICCOLO, WAIVED: 46 mg/dL — AB (ref >59)
LDL CHOL CALC PICCOLO WAIVED: 113 mg/dL — AB (ref <100)
TRIGLYCERIDES PICCOLO,WAIVED: 303 mg/dL — AB (ref <150)
VLDL CHOL CALC PICCOLO,WAIVE: 61 mg/dL — AB (ref <30)

## 2017-03-16 LAB — BAYER DCA HB A1C WAIVED: HB A1C (BAYER DCA - WAIVED): 8.8 % — ABNORMAL HIGH (ref <7.0)

## 2017-03-16 MED ORDER — AMLODIPINE BESYLATE 10 MG PO TABS: 10.0000 mg | ORAL_TABLET | Freq: Every day | ORAL | 1 refills | Status: DC

## 2017-03-16 NOTE — Assessment & Plan Note (Signed)
Discuss hypertension poor control will increase amlodipine from 5 mg to 10 mg recheck in a month or so to assess improvement. Discuss diet exercise nutrition.

## 2017-03-16 NOTE — Assessment & Plan Note (Signed)
Discussed patient's diabetes continues to go up hemoglobin A1c of 8.8 today patient on multiple medications including 2 different kinds of insulin was still poor control. To do a better job for the patient with disease and risk modification will refer to endocrinology to further assist in management of diabetes.

## 2017-03-16 NOTE — Progress Notes (Signed)
BP (!) 152/89   Pulse 93   Wt 256 lb 3.2 oz (116.2 kg)   SpO2 92%   BMI 33.80 kg/m    Subjective:    Patient ID: Jerry Pollard, male    DOB: [birth year], 66 y.o.   MRN: [id number]  HPI: Jerry Pollard is a 66 y.o. male  Chief Complaint  Patient presents with  . Follow-up  . Hypertension  . Hyperlipidemia  . Diabetes  Patient follow-up hypertension has been taking amlodipine Benzapril without problems or issues taking faithfully has been having a lot of stress in going hard and blood pressures been intermittently elevated as it is elevated today. Diabetes doing well no complaints no low blood sugar spells concerned was on prednisone for about 5 days for bad knee arthritis. Patient's eventually need knee replacement surgery. Cholesterol gout all doing okay. Taking medications without problems  Relevant past medical, surgical, family and social history reviewed and updated as indicated. Interim medical history since our last visit reviewed. Allergies and medications reviewed and updated.  Review of Systems  Constitutional: Negative.   Respiratory: Negative.   Cardiovascular: Negative.     Per HPI unless specifically indicated above     Objective:    BP (!) 152/89   Pulse 93   Wt 256 lb 3.2 oz (116.2 kg)   SpO2 92%   BMI 33.80 kg/m   Wt Readings from Last 3 Encounters:  03/16/17 256 lb 3.2 oz (116.2 kg)  11/29/16 253 lb (114.8 kg)  08/30/16 255 lb (115.7 kg)    Physical Exam  Constitutional: He is oriented to person, place, and time. He appears well-developed and well-nourished.  HENT:  Head: Normocephalic and atraumatic.  Eyes: Conjunctivae and EOM are normal.  Neck: Normal range of motion.  Cardiovascular: Normal rate, regular rhythm and normal heart sounds.   Pulmonary/Chest: Effort normal and breath sounds normal.  Musculoskeletal: Normal range of motion.  Neurological: He is alert and oriented to person, place, and time.  Skin: No erythema.  Psychiatric: He  has a normal mood and affect. His behavior is normal. Judgment and thought content normal.    Results for orders placed or performed in visit on 03/16/17  Bayer DCA Hb A1c Waived  Result Value Ref Range   Bayer DCA Hb A1c Waived 8.8 (H) <7.0 %  LP+ALT+AST Piccolo, Waived  Result Value Ref Range   ALT (SGPT) Piccolo, Waived 28 10 - 47 U/L   AST (SGOT) Piccolo, Waived 21 11 - 38 U/L   Cholesterol Piccolo, Waived 219 (H) <200 mg/dL   HDL Chol Piccolo, Waived 46 (L) >59 mg/dL   Triglycerides Piccolo,Waived 303 (H) <150 mg/dL   Chol/HDL Ratio Piccolo,Waive 4.8 mg/dL   LDL Chol Calc Piccolo Waived 113 (H) <100 mg/dL   VLDL Chol Calc Piccolo,Waive 61 (H) <30 mg/dL      Assessment & Plan:   Problem List Items Addressed This Visit      Cardiovascular and Mediastinum   Essential hypertension - Primary    Discuss hypertension poor control will increase amlodipine from 5 mg to 10 mg recheck in a month or so to assess improvement. Discuss diet exercise nutrition.      Relevant Medications   amLODipine (NORVASC) 10 MG tablet   Other Relevant Orders   Bayer DCA Hb A1c Waived (Completed)   Basic metabolic panel   LP+ALT+AST Piccolo, Waived (Completed)     Endocrine   Type 2 diabetes mellitus with peripheral neuropathy ([place])  Relevant Orders   Bayer DCA Hb A1c Waived (Completed)   Basic metabolic panel   LP+ALT+AST Piccolo, Waived (Completed)   Ambulatory referral to Endocrinology   CKD stage 3 due to type 2 diabetes mellitus ([place])    Discussed patient's diabetes continues to go up hemoglobin A1c of 8.8 today patient on multiple medications including 2 different kinds of insulin was still poor control. To do a better job for the patient with disease and risk modification will refer to endocrinology to further assist in management of diabetes.        Other   Hyperlipidemia    Poor control will not adjust medications at this point but encourage better diet and lifestyle nutrition         Relevant Medications   amLODipine (NORVASC) 10 MG tablet   Other Relevant Orders   Bayer DCA Hb A1c Waived (Completed)   Basic metabolic panel   LP+ALT+AST Piccolo, Waived (Completed)   Gout    Only taking Uloric part-time because of cost          Follow up plan: Return in about 3 months (around 06/15/2017) for Physical Exam, Hemoglobin A1c, uric acid.

## 2017-03-16 NOTE — Assessment & Plan Note (Signed)
Only taking Uloric part-time because of cost

## 2017-03-16 NOTE — Assessment & Plan Note (Addendum)
Poor control will not adjust medications at this point but encourage better diet and lifestyle nutrition

## 2017-03-17 ENCOUNTER — Telehealth: Payer: Self-pay

## 2017-03-17 LAB — BASIC METABOLIC PANEL
BUN / CREAT RATIO: 17 (ref 10–24)
BUN: 19 mg/dL (ref 8–27)
CO2: 23 mmol/L (ref 18–29)
CREATININE: 1.14 mg/dL (ref 0.76–1.27)
Calcium: 9.2 mg/dL (ref 8.6–10.2)
Chloride: 103 mmol/L (ref 96–106)
GFR calc non Af Amer: 67 mL/min/{1.73_m2} (ref >59)
GFR, EST AFRICAN AMERICAN: 77 mL/min/{1.73_m2} (ref >59)
Glucose: 87 mg/dL (ref 65–99)
POTASSIUM: 3.8 mmol/L (ref 3.5–5.2)
SODIUM: 142 mmol/L (ref 134–144)

## 2017-03-17 NOTE — Telephone Encounter (Signed)
Released via mychart

## 2017-03-17 NOTE — Telephone Encounter (Signed)
-----   Message from Guadalupe Maple, MD sent at 03/17/2017 10:40 AM EDT ----- Call tell normal labs Or send a letter

## 2017-05-16 DIAGNOSIS — E1129 Type 2 diabetes mellitus with other diabetic kidney complication: Secondary | ICD-10-CM | POA: Diagnosis not present

## 2017-05-16 DIAGNOSIS — R809 Proteinuria, unspecified: Secondary | ICD-10-CM | POA: Diagnosis not present

## 2017-05-16 DIAGNOSIS — E1165 Type 2 diabetes mellitus with hyperglycemia: Secondary | ICD-10-CM | POA: Diagnosis not present

## 2017-05-16 DIAGNOSIS — E782 Mixed hyperlipidemia: Secondary | ICD-10-CM | POA: Diagnosis not present

## 2017-05-16 DIAGNOSIS — Z794 Long term (current) use of insulin: Secondary | ICD-10-CM | POA: Diagnosis not present

## 2017-05-23 ENCOUNTER — Encounter: Payer: Self-pay | Admitting: Family Medicine

## 2017-05-25 ENCOUNTER — Telehealth: Payer: Self-pay | Admitting: Family Medicine

## 2017-05-25 NOTE — Telephone Encounter (Signed)
Called pt to schedule Annual Wellness Visit with [facility]  - knb  °

## 2017-06-08 ENCOUNTER — Other Ambulatory Visit: Payer: Self-pay | Admitting: Family Medicine

## 2017-06-08 NOTE — Telephone Encounter (Signed)
Last OV: 03/16/17 Next OV: 07/03/17  BMP Latest Ref Rng & Units 03/16/2017 11/29/2016 08/04/2016  Glucose 65 - 99 mg/dL 87 81 142(H)  BUN 8 - 27 mg/dL 19 26 26   Creatinine 0.76 - 1.27 mg/dL 1.14 1.28(H) 1.28(H)  BUN/Creat Ratio 10 - 24 17 20 20   Sodium 134 - 144 mmol/L 142 144 141  Potassium 3.5 - 5.2 mmol/L 3.8 3.5 4.4  Chloride 96 - 106 mmol/L 103 100 101  CO2 18 - 29 mmol/L 23 24 20   Calcium 8.6 - 10.2 mg/dL 9.2 9.3 9.2

## 2017-06-11 ENCOUNTER — Other Ambulatory Visit: Payer: Self-pay | Admitting: Family Medicine

## 2017-06-28 ENCOUNTER — Ambulatory Visit (INDEPENDENT_AMBULATORY_CARE_PROVIDER_SITE_OTHER): Payer: PPO

## 2017-06-28 VITALS — BP 146/78 | HR 68 | Temp 97.7°F | Ht 73.0 in | Wt 259.2 lb

## 2017-06-28 DIAGNOSIS — Z0000 Encounter for general adult medical examination without abnormal findings: Secondary | ICD-10-CM

## 2017-06-28 DIAGNOSIS — Z23 Encounter for immunization: Secondary | ICD-10-CM | POA: Diagnosis not present

## 2017-06-28 NOTE — Progress Notes (Signed)
Subjective:   Jerry Pollard is a 66 y.o. male who presents for Medicare Annual/Subsequent preventive examination.  Review of Systems:   Cardiac Risk Factors include: male gender;advanced age (>39men, >70 women);diabetes mellitus;dyslipidemia;hypertension;obesity (BMI >30kg/m2)     Objective:    Vitals: BP (!) 146/78 (BP Location: Left Arm, Patient Position: Sitting)   Pulse 68   Temp 97.7 F (36.5 C)   Ht 6\' 1"  (1.854 m)   Wt 259 lb 3.2 oz (117.6 kg)   BMI 34.20 kg/m   Body mass index is 34.2 kg/m.  Tobacco History  Smoking Status  . Former Smoker  . Types: Cigarettes  . Quit date: 07/12/1997  Smokeless Tobacco  . Never Used     Counseling given: Not Answered   Past Medical History:  Diagnosis Date  . Arthritis   . Chronic kidney disease   . Depression   . Diabetes mellitus without complication ([place])   . GERD (gastroesophageal reflux disease)   . Gout   . Hyperlipidemia    Past Surgical History:  Procedure Laterality Date  . APPENDECTOMY    . CARPAL TUNNEL RELEASE Right   . CHOLECYSTECTOMY    . SPINE SURGERY     Family History  Problem Relation Age of Onset  . Hypertension Mother   . Cancer Mother   . Cancer Father   . Heart attack Brother    History  Sexual Activity  . Sexual activity: Not on file    Outpatient Encounter Prescriptions as of 06/28/2017  Medication Sig  . amLODipine (NORVASC) 10 MG tablet Take 1 tablet (10 mg total) by mouth daily.  . benazepril (LOTENSIN) 40 MG tablet TAKE 1 TABLET (40 MG TOTAL) BY MOUTH DAILY.  Marland Kitchen colchicine 0.6 MG tablet Take 1 tablet (0.6 mg total) by mouth daily.  . diclofenac sodium (VOLTAREN) 1 % GEL 4 GMS GEL FOUR TIMES DAILY TRANSDERMAL  . fexofenadine (ALLEGRA) 180 MG tablet Take 180 mg by mouth daily.  . fluticasone (FLONASE) 50 MCG/ACT nasal spray INSTILL 2 SPRAYS INTO EACH NOSTRIL ONCE DAILY  . gabapentin (NEURONTIN) 300 MG capsule Take 1 capsule (300 mg total) by mouth 2 (two) times daily.  Marland Kitchen  gemfibrozil (LOPID) 600 MG tablet Take 1 tablet (600 mg total) by mouth 2 (two) times daily before a meal.  . insulin aspart (NOVOLOG FLEXPEN) 100 UNIT/ML FlexPen INJECT 50-60 UNITS UNDER THE SKIN 3 TIMES A DAY  . LEVEMIR FLEXTOUCH 100 UNIT/ML Pen INJECT 50 UNITS INTO THE SKIN DAILY AT 10 PM.  . metFORMIN (GLUCOPHAGE-XR) 500 MG 24 hr tablet Take 500 mg by mouth 3 (three) times daily.  Marland Kitchen NOVOLOG FLEXPEN 100 UNIT/ML FlexPen INJECT 50-60 UNITS UNDER THE SKIN 3 TIMES A DAY  . ONE TOUCH ULTRA TEST test strip USE AS DIRECTED  . pantoprazole (PROTONIX) 40 MG tablet TAKE 1 TABLET BY MOUTH EVERY DAY  . traMADol (ULTRAM) 50 MG tablet Take by mouth.  Marland Kitchen ULORIC 80 MG TABS Take 80 mg by mouth daily.  . [DISCONTINUED] ULORIC 40 MG tablet    No facility-administered encounter medications on file as of 06/28/2017.     Activities of Daily Living In your present state of health, do you have any difficulty performing the following activities: 06/28/2017  Hearing? N  Vision? N  Difficulty concentrating or making decisions? N  Walking or climbing stairs? N  Dressing or bathing? N  Doing errands, shopping? N  Preparing Food and eating ? N  Using the Toilet? N  In the past six months, have you accidently leaked urine? N  Do you have problems with loss of bowel control? N  Managing your Medications? N  Managing your Finances? N  Housekeeping or managing your Housekeeping? N  Some recent data might be hidden    Patient Care Team: Guadalupe Maple, MD as PCP - General (Family Medicine) Emmaline Kluver., MD (Rheumatology) Leanor Kail, MD (Orthopedic Surgery) Gabriel Carina Betsey Holiday, MD as Physician Assistant (Endocrinology)   Assessment:     Exercise Activities and Dietary recommendations Current Exercise Habits: The patient has a physically strenous job, but has no regular exercise apart from work.  Goals    . Increase water intake          Recommend drinking at least 6-8 glasses of water a day         Fall Risk Fall Risk  06/28/2017 03/16/2017 05/30/2016 02/10/2016  Falls in the past year? No No Yes No  Number falls in past yr: - - 1 -  Injury with Fall? - - No -   Depression Screen PHQ 2/9 Scores 06/28/2017 05/30/2016 02/10/2016  PHQ - 2 Score 0 0 0    Cognitive Function     6CIT Screen 06/28/2017  What Year? 0 points  What month? 0 points  What time? 0 points  Count back from 20 0 points  Months in reverse 0 points  Repeat phrase 2 points  Total Score 2    Immunization History  Administered Date(s) Administered  . Influenza-Unspecified 09/16/2014, 09/08/2015, 08/13/2016  . Pneumococcal Conjugate-13 05/30/2016  . Pneumococcal Polysaccharide-23 06/28/2017  . Td 09/13/2005  . Tdap 05/30/2016   Screening Tests Health Maintenance  Topic Date Due  . INFLUENZA VACCINE  07/12/2017  . HEMOGLOBIN A1C  09/15/2017  . OPHTHALMOLOGY EXAM  05/12/2018  . FOOT EXAM  05/16/2018  . COLONOSCOPY  09/07/2023  . TETANUS/TDAP  05/30/2026  . Hepatitis C Screening  Completed  . PNA vac Low Risk Adult  Completed      Plan:    I have personally reviewed and addressed the Medicare Annual Wellness questionnaire and have noted the following in the patient's chart:  A. Medical and social history B. Use of alcohol, tobacco or illicit drugs  C. Current medications and supplements D. Functional ability and status E.  Nutritional status F.  Physical activity G. Advance directives H. List of other physicians I.  Hospitalizations, surgeries, and ER visits in previous 12 months J.  [place] such as hearing and vision if needed, cognitive and depression L. Referrals and appointments   In addition, I have reviewed and discussed with patient certain preventive protocols, quality metrics, and best practice recommendations. A written personalized care plan for preventive services as well as general preventive health recommendations were provided to patient.   Signed,  Tyler Aas,  LPN Nurse Health Advisor   MD Recommendations:none

## 2017-06-28 NOTE — Patient Instructions (Addendum)
Jerry Pollard , Thank you for taking time to come for your Medicare Wellness Visit. I appreciate your ongoing commitment to your health goals. Please review the following plan we discussed and let me know if I can assist you in the future.   Screening recommendations/referrals: Colonoscopy: Completed 09/13/2013 Recommended yearly ophthalmology/optometry visit for glaucoma screening and checkup Recommended yearly dental visit for hygiene and checkup  Vaccinations: Influenza vaccine: up to date, due 08/2017 Pneumococcal vaccine: pneumovax 23 done  Tdap vaccine: up to date Shingles vaccine: due, check with your insurance company for coverage  Advanced directives: Please bring a copy of your health care power of attorney and living will to the office at your convenience.  Conditions/risks identified: Recommend drinking at least 6-8 glasses of water a day   Next appointment: Follow up on 07/03/2017 at 1:00pm with Dr.Crissman. Follow up in one year for your annual wellness exam.  Preventive Care 65 Years and Older, Male Preventive care refers to lifestyle choices and visits with your health care provider that can promote health and wellness. What does preventive care include?  A yearly physical exam. This is also called an annual well check.  Dental exams once or twice a year.  Routine eye exams. Ask your health care provider how often you should have your eyes checked.  Personal lifestyle choices, including:  Daily care of your teeth and gums.  Regular physical activity.  Eating a healthy diet.  Avoiding tobacco and drug use.  Limiting alcohol use.  Practicing safe sex.  Taking low doses of aspirin every day.  Taking vitamin and mineral supplements as recommended by your health care provider. What happens during an annual well check? The services and screenings done by your health care provider during your annual well check will depend on your age, overall health, lifestyle risk  factors, and family history of disease. Counseling  Your health care provider may ask you questions about your:  Alcohol use.  Tobacco use.  Drug use.  Emotional well-being.  Home and relationship well-being.  Sexual activity.  Eating habits.  History of falls.  Memory and ability to understand (cognition).  Work and work Statistician. Screening  You may have the following tests or measurements:  Height, weight, and BMI.  Blood pressure.  Lipid and cholesterol levels. These may be checked every 5 years, or more frequently if you are over 93 years old.  Skin check.  Lung cancer screening. You may have this screening every year starting at age 2 if you have a 30-pack-year history of smoking and currently smoke or have quit within the past 15 years.  Fecal occult blood test (FOBT) of the stool. You may have this test every year starting at age 106.  Flexible sigmoidoscopy or colonoscopy. You may have a sigmoidoscopy every 5 years or a colonoscopy every 10 years starting at age 56.  Prostate cancer screening. Recommendations will vary depending on your family history and other risks.  Hepatitis C blood test.  Hepatitis B blood test.  Sexually transmitted disease (STD) testing.  Diabetes screening. This is done by checking your blood sugar (glucose) after you have not eaten for a while (fasting). You may have this done every 1-3 years.  Abdominal aortic aneurysm (AAA) screening. You may need this if you are a current or former smoker.  Osteoporosis. You may be screened starting at age 59 if you are at high risk. Talk with your health care provider about your test results, treatment options, and if necessary,  the need for more tests. Vaccines  Your health care provider may recommend certain vaccines, such as:  Influenza vaccine. This is recommended every year.  Tetanus, diphtheria, and acellular pertussis (Tdap, Td) vaccine. You may need a Td booster every 10  years.  Zoster vaccine. You may need this after age 66.  Pneumococcal 13-valent conjugate (PCV13) vaccine. One dose is recommended after age 8.  Pneumococcal polysaccharide (PPSV23) vaccine. One dose is recommended after age 75. Talk to your health care provider about which screenings and vaccines you need and how often you need them. This information is not intended to replace advice given to you by your health care provider. Make sure you discuss any questions you have with your health care provider. Document Released: 12/25/2015 Document Revised: 08/17/2016 Document Reviewed: 09/29/2015 Elsevier Interactive Patient Education  2017 [place] Prevention in the Home Falls can cause injuries. They can happen to people of all ages. There are many things you can do to make your home safe and to help prevent falls. What can I do on the outside of my home?  Regularly fix the edges of walkways and driveways and fix any cracks.  Remove anything that might make you trip as you walk through a door, such as a raised step or threshold.  Trim any bushes or trees on the path to your home.  Use bright outdoor lighting.  Clear any walking paths of anything that might make someone trip, such as rocks or tools.  Regularly check to see if handrails are loose or broken. Make sure that both sides of any steps have handrails.  Any raised decks and porches should have guardrails on the edges.  Have any leaves, snow, or ice cleared regularly.  Use sand or salt on walking paths during winter.  Clean up any spills in your garage right away. This includes oil or grease spills. What can I do in the bathroom?  Use night lights.  Install grab bars by the toilet and in the tub and shower. Do not use towel bars as grab bars.  Use non-skid mats or decals in the tub or shower.  If you need to sit down in the shower, use a plastic, non-slip stool.  Keep the floor dry. Clean up any water that  spills on the floor as soon as it happens.  Remove soap buildup in the tub or shower regularly.  Attach bath mats securely with double-sided non-slip rug tape.  Do not have throw rugs and other things on the floor that can make you trip. What can I do in the bedroom?  Use night lights.  Make sure that you have a light by your bed that is easy to reach.  Do not use any sheets or blankets that are too big for your bed. They should not hang down onto the floor.  Have a firm chair that has side arms. You can use this for support while you get dressed.  Do not have throw rugs and other things on the floor that can make you trip. What can I do in the kitchen?  Clean up any spills right away.  Avoid walking on wet floors.  Keep items that you use a lot in easy-to-reach places.  If you need to reach something above you, use a strong step stool that has a grab bar.  Keep electrical cords out of the way.  Do not use floor polish or wax that makes floors slippery. If you must use  wax, use non-skid floor wax.  Do not have throw rugs and other things on the floor that can make you trip. What can I do with my stairs?  Do not leave any items on the stairs.  Make sure that there are handrails on both sides of the stairs and use them. Fix handrails that are broken or loose. Make sure that handrails are as long as the stairways.  Check any carpeting to make sure that it is firmly attached to the stairs. Fix any carpet that is loose or worn.  Avoid having throw rugs at the top or bottom of the stairs. If you do have throw rugs, attach them to the floor with carpet tape.  Make sure that you have a light switch at the top of the stairs and the bottom of the stairs. If you do not have them, ask someone to add them for you. What else can I do to help prevent falls?  Wear shoes that:  Do not have high heels.  Have rubber bottoms.  Are comfortable and fit you well.  Are closed at the  toe. Do not wear sandals.  If you use a stepladder:  Make sure that it is fully opened. Do not climb a closed stepladder.  Make sure that both sides of the stepladder are locked into place.  Ask someone to hold it for you, if possible.  Clearly mark and make sure that you can see:  Any grab bars or handrails.  First and last steps.  Where the edge of each step is.  Use tools that help you move around (mobility aids) if they are needed. These include:  Canes.  Walkers.  Scooters.  Crutches.  Turn on the lights when you go into a dark area. Replace any light bulbs as soon as they burn out.  Set up your furniture so you have a clear path. Avoid moving your furniture around.  If any of your floors are uneven, fix them.  If there are any pets around you, be aware of where they are.  Review your medicines with your doctor. Some medicines can make you feel dizzy. This can increase your chance of falling. Ask your doctor what other things that you can do to help prevent falls. This information is not intended to replace advice given to you by your health care provider. Make sure you discuss any questions you have with your health care provider. Document Released: 09/24/2009 Document Revised: 05/05/2016 Document Reviewed: 01/02/2015 Elsevier Interactive Patient Education  2017 [employer].

## 2017-06-29 ENCOUNTER — Other Ambulatory Visit: Payer: Self-pay | Admitting: Family Medicine

## 2017-06-29 ENCOUNTER — Encounter: Payer: PPO | Admitting: Family Medicine

## 2017-07-03 ENCOUNTER — Encounter: Payer: Self-pay | Admitting: Family Medicine

## 2017-07-03 ENCOUNTER — Ambulatory Visit (INDEPENDENT_AMBULATORY_CARE_PROVIDER_SITE_OTHER): Payer: PPO | Admitting: Family Medicine

## 2017-07-03 VITALS — BP 136/82 | HR 63 | Wt 257.0 lb

## 2017-07-03 DIAGNOSIS — Z1329 Encounter for screening for other suspected endocrine disorder: Secondary | ICD-10-CM | POA: Diagnosis not present

## 2017-07-03 DIAGNOSIS — Z125 Encounter for screening for malignant neoplasm of prostate: Secondary | ICD-10-CM

## 2017-07-03 DIAGNOSIS — E785 Hyperlipidemia, unspecified: Secondary | ICD-10-CM

## 2017-07-03 DIAGNOSIS — N400 Enlarged prostate without lower urinary tract symptoms: Secondary | ICD-10-CM | POA: Diagnosis not present

## 2017-07-03 DIAGNOSIS — I10 Essential (primary) hypertension: Secondary | ICD-10-CM | POA: Diagnosis not present

## 2017-07-03 DIAGNOSIS — Z7189 Other specified counseling: Secondary | ICD-10-CM | POA: Insufficient documentation

## 2017-07-03 DIAGNOSIS — M1990 Unspecified osteoarthritis, unspecified site: Secondary | ICD-10-CM | POA: Diagnosis not present

## 2017-07-03 DIAGNOSIS — M109 Gout, unspecified: Secondary | ICD-10-CM | POA: Diagnosis not present

## 2017-07-03 DIAGNOSIS — E1142 Type 2 diabetes mellitus with diabetic polyneuropathy: Secondary | ICD-10-CM

## 2017-07-03 DIAGNOSIS — Z0000 Encounter for general adult medical examination without abnormal findings: Secondary | ICD-10-CM | POA: Diagnosis not present

## 2017-07-03 MED ORDER — AMLODIPINE BESYLATE 10 MG PO TABS: 10.0000 mg | ORAL_TABLET | Freq: Every day | ORAL | 4 refills | Status: DC

## 2017-07-03 MED ORDER — GABAPENTIN 300 MG PO CAPS: 300.0000 mg | ORAL_CAPSULE | Freq: Two times a day (BID) | ORAL | 4 refills | Status: DC

## 2017-07-03 MED ORDER — FLUTICASONE PROPIONATE 50 MCG/ACT NA SUSP: 2.0000 | Freq: Every day | NASAL | 11 refills | Status: DC

## 2017-07-03 MED ORDER — TRAMADOL HCL 50 MG PO TABS: 50.0000 mg | ORAL_TABLET | Freq: Every day | ORAL | 1 refills | Status: DC | PRN

## 2017-07-03 MED ORDER — ULORIC 80 MG PO TABS: 80.0000 mg | ORAL_TABLET | Freq: Every day | ORAL | 12 refills | Status: DC

## 2017-07-03 MED ORDER — BENAZEPRIL HCL 40 MG PO TABS: 40.0000 mg | ORAL_TABLET | Freq: Every day | ORAL | 4 refills | Status: DC

## 2017-07-03 MED ORDER — PANTOPRAZOLE SODIUM 40 MG PO TBEC: 40.0000 mg | DELAYED_RELEASE_TABLET | Freq: Every day | ORAL | 4 refills | Status: DC

## 2017-07-03 NOTE — Progress Notes (Signed)
BP 136/82 (BP Location: Left Arm)   Pulse 63   Wt 257 lb (116.6 kg)   SpO2 96%   BMI 33.91 kg/m    Subjective:    Patient ID: Jerry Pollard, male    DOB: 1951/09/25, 66 y.o.   MRN: [id number]  HPI: Jerry Pollard is a 66 y.o. male  Chief Complaint  Patient presents with  . Annual Exam  Patient follow-up going to endocrinology for diabetes care has changed to NPH insulins and saving significant amounts started back on metformin seems to be doing well with better glucose control. Blood pressure doing well no complaints No gout symptoms. Tramadol usage 1 maybe 2 a month. Takes at nighttime to help with sleep if has had a big day with a lot of muscular skeletal activity. Doing okay with Uloric. Patient also concerned about allergy symptoms does a lot of sneezing has started Allegra has Flonase hasn't started that. Discussed nasal rinse.  Relevant past medical, surgical, family and social history reviewed and updated as indicated. Interim medical history since our last visit reviewed. Allergies and medications reviewed and updated.  Review of Systems  Constitutional: Negative.   HENT: Negative.   Eyes: Negative.   Respiratory: Negative.   Cardiovascular: Negative.   Gastrointestinal: Negative.   Endocrine: Negative.   Genitourinary: Negative.   Musculoskeletal: Negative.   Skin: Negative.   Allergic/Immunologic: Negative.   Neurological: Negative.   Hematological: Negative.   Psychiatric/Behavioral: Negative.     Per HPI unless specifically indicated above     Objective:    BP 136/82 (BP Location: Left Arm)   Pulse 63   Wt 257 lb (116.6 kg)   SpO2 96%   BMI 33.91 kg/m   Wt Readings from Last 3 Encounters:  07/03/17 257 lb (116.6 kg)  06/28/17 259 lb 3.2 oz (117.6 kg)  03/16/17 256 lb 3.2 oz (116.2 kg)    Physical Exam  Constitutional: He is oriented to person, place, and time. He appears well-developed and well-nourished.  HENT:  Head: Normocephalic and  atraumatic.  Right Ear: External ear normal.  Left Ear: External ear normal.  Eyes: Pupils are equal, round, and reactive to light. Conjunctivae and EOM are normal.  Neck: Normal range of motion. Neck supple.  Cardiovascular: Normal rate, regular rhythm, normal heart sounds and intact distal pulses.   Pulmonary/Chest: Effort normal and breath sounds normal.  Abdominal: Soft. Bowel sounds are normal. There is no splenomegaly or hepatomegaly.  Genitourinary: Rectum normal and penis normal.  Genitourinary Comments: BPH changes  Musculoskeletal: Normal range of motion.  Neurological: He is alert and oriented to person, place, and time. He has normal reflexes.  Skin: No rash noted. No erythema.  Psychiatric: He has a normal mood and affect. His behavior is normal. Judgment and thought content normal.    Results for orders placed or performed in visit on 03/16/17  Bayer DCA Hb A1c Waived  Result Value Ref Range   Bayer DCA Hb A1c Waived 8.8 (H) <9.9 %  Basic metabolic panel  Result Value Ref Range   Glucose 87 65 - 99 mg/dL   BUN 19 8 - 27 mg/dL   Creatinine, Ser 1.14 0.76 - 1.27 mg/dL   GFR calc non Af Amer 67 >59 mL/min/1.73   GFR calc Af Amer 77 >59 mL/min/1.73   BUN/Creatinine Ratio 17 10 - 24   Sodium 142 134 - 144 mmol/L   Potassium 3.8 3.5 - 5.2 mmol/L   Chloride 103 96 -  106 mmol/L   CO2 23 18 - 29 mmol/L   Calcium 9.2 8.6 - 10.2 mg/dL  LP+ALT+AST Piccolo, Waived  Result Value Ref Range   ALT (SGPT) Piccolo, Waived 28 10 - 47 U/L   AST (SGOT) Piccolo, Waived 21 11 - 38 U/L   Cholesterol Piccolo, Waived 219 (H) <200 mg/dL   HDL Chol Piccolo, Waived 46 (L) >59 mg/dL   Triglycerides Piccolo,Waived 303 (H) <150 mg/dL   Chol/HDL Ratio Piccolo,Waive 4.8 mg/dL   LDL Chol Calc Piccolo Waived 113 (H) <100 mg/dL   VLDL Chol Calc Piccolo,Waive 61 (H) <30 mg/dL      Assessment & Plan:   Problem List Items Addressed This Visit      Cardiovascular and Mediastinum   Essential  hypertension    The current medical regimen is effective;  continue present plan and medications.       Relevant Medications   amLODipine (NORVASC) 10 MG tablet   benazepril (LOTENSIN) 40 MG tablet   Other Relevant Orders   CBC with Differential/Platelet   Comprehensive metabolic panel   TSH     Endocrine   Type 2 diabetes mellitus with peripheral neuropathy (HCC)   Relevant Medications   benazepril (LOTENSIN) 40 MG tablet   gabapentin (NEURONTIN) 300 MG capsule   Other Relevant Orders   CBC with Differential/Platelet   Comprehensive metabolic panel   TSH   Urinalysis, Routine w reflex microscopic     Musculoskeletal and Integument   Arthritis    occ tramadol use      Relevant Medications   traMADol (ULTRAM) 50 MG tablet   ULORIC 80 MG TABS     Genitourinary   BPH (benign prostatic hyperplasia)    stable      Relevant Orders   PSA     Other   Hyperlipidemia   Relevant Medications   amLODipine (NORVASC) 10 MG tablet   benazepril (LOTENSIN) 40 MG tablet   Other Relevant Orders   Comprehensive metabolic panel   Lipid panel   TSH   Gout   Relevant Medications   ULORIC 80 MG TABS   Other Relevant Orders   Uric acid   Advanced care planning/counseling discussion    A voluntary discussion about advance care planning including the explanation and discussion of advance directives was extensively discussed  with the patient.  Explanation about the health care proxy and Living will was reviewed and packet with forms with explanation of how to fill them out was given. Pt has done and will bring by a copy.       Other Visit Diagnoses    Prostate cancer screening    -  Primary   Relevant Orders   PSA   PE (physical exam), annual       Thyroid disorder screen       Relevant Orders   TSH       Follow up plan: Return in about 6 months (around 01/03/2018) for BMP,  Lipids, ALT, AST.

## 2017-07-03 NOTE — Assessment & Plan Note (Signed)
occ tramadol use

## 2017-07-03 NOTE — Assessment & Plan Note (Signed)
A voluntary discussion about advance care planning including the explanation and discussion of advance directives was extensively discussed  with the patient.  Explanation about the health care proxy and Living will was reviewed and packet with forms with explanation of how to fill them out was given. Pt has done and will bring by a copy.

## 2017-07-03 NOTE — Assessment & Plan Note (Signed)
stable °

## 2017-07-03 NOTE — Assessment & Plan Note (Signed)
The current medical regimen is effective;  continue present plan and medications.  

## 2017-07-04 ENCOUNTER — Telehealth: Payer: Self-pay

## 2017-07-04 ENCOUNTER — Encounter: Payer: Self-pay | Admitting: Family Medicine

## 2017-07-04 LAB — CBC WITH DIFFERENTIAL/PLATELET
Basophils Absolute: 0 10*3/uL (ref 0.0–0.2)
Basos: 1 %
EOS (ABSOLUTE): 0.2 10*3/uL (ref 0.0–0.4)
EOS: 3 %
HEMATOCRIT: 40.9 % (ref 37.5–51.0)
Hemoglobin: 13.6 g/dL (ref 13.0–17.7)
Immature Grans (Abs): 0 10*3/uL (ref 0.0–0.1)
Immature Granulocytes: 0 %
LYMPHS ABS: 1.4 10*3/uL (ref 0.7–3.1)
Lymphs: 24 %
MCH: 29.3 pg (ref 26.6–33.0)
MCHC: 33.3 g/dL (ref 31.5–35.7)
MCV: 88 fL (ref 79–97)
MONOS ABS: 0.4 10*3/uL (ref 0.1–0.9)
Monocytes: 6 %
NEUTROS ABS: 3.9 10*3/uL (ref 1.4–7.0)
Neutrophils: 66 %
Platelets: 250 10*3/uL (ref 150–379)
RBC: 4.64 x10E6/uL (ref 4.14–5.80)
RDW: 14.5 % (ref 12.3–15.4)
WBC: 5.9 10*3/uL (ref 3.4–10.8)

## 2017-07-04 LAB — LIPID PANEL
Chol/HDL Ratio: 5.6 ratio — ABNORMAL HIGH (ref 0.0–5.0)
Cholesterol, Total: 190 mg/dL (ref 100–199)
HDL: 34 mg/dL — ABNORMAL LOW (ref >39)
LDL Calculated: 110 mg/dL — ABNORMAL HIGH (ref 0–99)
TRIGLYCERIDES: 229 mg/dL — AB (ref 0–149)
VLDL Cholesterol Cal: 46 mg/dL — ABNORMAL HIGH (ref 5–40)

## 2017-07-04 LAB — TSH: TSH: 4.43 u[IU]/mL (ref 0.450–4.500)

## 2017-07-04 LAB — COMPREHENSIVE METABOLIC PANEL
A/G RATIO: 2.2 (ref 1.2–2.2)
ALK PHOS: 84 IU/L (ref 39–117)
ALT: 17 IU/L (ref 0–44)
AST: 20 IU/L (ref 0–40)
Albumin: 4.9 g/dL — ABNORMAL HIGH (ref 3.6–4.8)
BUN/Creatinine Ratio: 21 (ref 10–24)
BUN: 27 mg/dL (ref 8–27)
Bilirubin Total: 0.3 mg/dL (ref 0.0–1.2)
CHLORIDE: 102 mmol/L (ref 96–106)
CO2: 20 mmol/L (ref 20–29)
CREATININE: 1.27 mg/dL (ref 0.76–1.27)
Calcium: 10 mg/dL (ref 8.6–10.2)
GFR calc Af Amer: 68 mL/min/{1.73_m2} (ref >59)
GFR calc non Af Amer: 58 mL/min/{1.73_m2} — ABNORMAL LOW (ref >59)
GLOBULIN, TOTAL: 2.2 g/dL (ref 1.5–4.5)
Glucose: 115 mg/dL — ABNORMAL HIGH (ref 65–99)
POTASSIUM: 4.7 mmol/L (ref 3.5–5.2)
SODIUM: 141 mmol/L (ref 134–144)
Total Protein: 7.1 g/dL (ref 6.0–8.5)

## 2017-07-04 LAB — PSA: Prostate Specific Ag, Serum: 2.9 ng/mL (ref 0.0–4.0)

## 2017-07-04 LAB — URIC ACID: Uric Acid: 6.1 mg/dL (ref 3.7–8.6)

## 2017-07-04 NOTE — Telephone Encounter (Signed)
P.A. Initiated via [email or web address] for uloric 80 mg. Scarlette Shorts Key: [id number] - [place] Case ID: [id number]  If denied, allopurinol is preferred medication.   [facility] [place] PH: [phone]

## 2017-07-06 LAB — URINALYSIS, ROUTINE W REFLEX MICROSCOPIC
BILIRUBIN UA: NEGATIVE
Glucose, UA: NEGATIVE
Nitrite, UA: NEGATIVE
PH UA: 5 (ref 5.0–7.5)
RBC UA: NEGATIVE
Specific Gravity, UA: 1.02 (ref 1.005–1.030)
Urobilinogen, Ur: 0.2 mg/dL (ref 0.2–1.0)

## 2017-07-06 LAB — MICROSCOPIC EXAMINATION

## 2017-07-10 DIAGNOSIS — M5414 Radiculopathy, thoracic region: Secondary | ICD-10-CM | POA: Diagnosis not present

## 2017-07-10 DIAGNOSIS — M9902 Segmental and somatic dysfunction of thoracic region: Secondary | ICD-10-CM | POA: Diagnosis not present

## 2017-07-11 DIAGNOSIS — M5414 Radiculopathy, thoracic region: Secondary | ICD-10-CM | POA: Diagnosis not present

## 2017-07-11 DIAGNOSIS — M9902 Segmental and somatic dysfunction of thoracic region: Secondary | ICD-10-CM | POA: Diagnosis not present

## 2017-07-19 ENCOUNTER — Telehealth: Payer: Self-pay | Admitting: Family Medicine

## 2017-07-19 MED ORDER — ALLOPURINOL 100 MG PO TABS: 100.0000 mg | ORAL_TABLET | Freq: Every day | ORAL | 6 refills | Status: DC

## 2017-07-19 NOTE — Telephone Encounter (Signed)
He was sent a letter on 7/24. His labs were normal. I've sent through allopurinol for him as his uloric is not covered.

## 2017-07-19 NOTE — Telephone Encounter (Signed)
Left message on machine for pt to return call to the office.  

## 2017-07-19 NOTE — Telephone Encounter (Signed)
P.A. Was initiated in June (Please see previous telephone encounter). P.A. Was Denied on July 24 [place] Case: [id number], Status: Denied. Notification: Completed.  Preferred medication is allopurinol. Pt is also asking for results on labs. Please advise.

## 2017-07-19 NOTE — Telephone Encounter (Signed)
Detailed message left on home number.

## 2017-07-19 NOTE — Telephone Encounter (Signed)
Patient needing a refill on medication for Uloric. [facility] [place] needs [facility] to be pre-[facility] by provider stating patient can take medication before refill medication.  Patient would also like to know his blood work results when they are ready from his last appointment.  Please Advise.  Thank you

## 2017-07-20 DIAGNOSIS — M9902 Segmental and somatic dysfunction of thoracic region: Secondary | ICD-10-CM | POA: Diagnosis not present

## 2017-07-20 DIAGNOSIS — M5414 Radiculopathy, thoracic region: Secondary | ICD-10-CM | POA: Diagnosis not present

## 2017-07-27 NOTE — Telephone Encounter (Addendum)
Call pt  Phone call patient doing okay on Uloric reviewed blood work and renal function was okay we will observe both gout symptoms uric acid levels and renal function next office visit.

## 2017-07-27 NOTE — Telephone Encounter (Signed)
Called and left the patient a VM letting him know that we will try to reach him again this afternoon.

## 2017-07-27 NOTE — Telephone Encounter (Signed)
Patient called with a concern about using the allopurinol instead of the uloric. He is wondering if the allopurinol works as good as the [employer] as Dr Jeananne Rama and Dr Jefm Bryant prefer the [employer]  Thank you  [phone]

## 2017-07-27 NOTE — Telephone Encounter (Signed)
Left message on machine for pt to return call to the office.  

## 2017-07-27 NOTE — Telephone Encounter (Signed)
Routing to provider. Uloric was denied by insurance, allopurinol sent in. Patient wants to discuss taking the allopurinol.

## 2017-07-31 NOTE — Telephone Encounter (Signed)
Left message on machine for pt to return call to the office.  

## 2017-08-01 NOTE — Telephone Encounter (Signed)
Patient was transferred to provider for telephone conversation.   

## 2017-11-20 ENCOUNTER — Emergency Department
Admission: EM | Admit: 2017-11-20 | Discharge: 2017-11-20 | Disposition: A | Discharge status: Home / Self Care | Payer: PPO | Attending: Emergency Medicine | Admitting: Emergency Medicine

## 2017-11-20 ENCOUNTER — Encounter: Payer: Self-pay | Admitting: Emergency Medicine

## 2017-11-20 ENCOUNTER — Other Ambulatory Visit: Payer: Self-pay

## 2017-11-20 DIAGNOSIS — Z794 Long term (current) use of insulin: Secondary | ICD-10-CM | POA: Insufficient documentation

## 2017-11-20 DIAGNOSIS — Z87891 Personal history of nicotine dependence: Secondary | ICD-10-CM | POA: Diagnosis not present

## 2017-11-20 DIAGNOSIS — Z791 Long term (current) use of non-steroidal anti-inflammatories (NSAID): Secondary | ICD-10-CM | POA: Diagnosis not present

## 2017-11-20 DIAGNOSIS — M62838 Other muscle spasm: Secondary | ICD-10-CM | POA: Diagnosis not present

## 2017-11-20 DIAGNOSIS — Z79899 Other long term (current) drug therapy: Secondary | ICD-10-CM | POA: Diagnosis not present

## 2017-11-20 DIAGNOSIS — N183 Chronic kidney disease, stage 3 (moderate): Secondary | ICD-10-CM | POA: Diagnosis not present

## 2017-11-20 DIAGNOSIS — M542 Cervicalgia: Secondary | ICD-10-CM | POA: Diagnosis present

## 2017-11-20 DIAGNOSIS — E1122 Type 2 diabetes mellitus with diabetic chronic kidney disease: Secondary | ICD-10-CM | POA: Diagnosis not present

## 2017-11-20 DIAGNOSIS — I129 Hypertensive chronic kidney disease with stage 1 through stage 4 chronic kidney disease, or unspecified chronic kidney disease: Secondary | ICD-10-CM | POA: Diagnosis not present

## 2017-11-20 MED ORDER — IBUPROFEN 600 MG PO TABS: 600.0000 mg | ORAL_TABLET | Freq: Four times a day (QID) | ORAL | 0 refills | Status: DC | PRN

## 2017-11-20 MED ORDER — KETOROLAC TROMETHAMINE 30 MG/ML IJ SOLN
30.0000 mg | Freq: Once | INTRAMUSCULAR | Status: AC
  Administered 2017-11-20: 30 mg via INTRAMUSCULAR
  Filled 2017-11-20: qty 1

## 2017-11-20 MED ORDER — CYCLOBENZAPRINE HCL 10 MG PO TABS: 10.0000 mg | ORAL_TABLET | Freq: Three times a day (TID) | ORAL | 0 refills | Status: DC | PRN

## 2017-11-20 NOTE — ED Triage Notes (Signed)
Pt c/o left sided neck pain for last 4 days.  Started after fell asleep in recliner. Pain increased when muscle spasms per pt.  Severe pain with turning to right side.Ambulatory to triage, VSS.

## 2017-11-20 NOTE — ED Notes (Signed)
See triage note  Presents with pain to left side of neck for about 4 days  Denies any specific injury but did fall asleep in recliner  States pain is getting worse over the past 2 days  States he has tried heat and ice to area w/o relief

## 2017-11-20 NOTE — ED Provider Notes (Signed)
[facility] Emergency Department Provider Note ____________________________________________  Time seen: Approximately 4:37 PM  I have reviewed the triage vital signs and the nursing notes.   HISTORY  Chief Complaint Neck Pain    HPI Jerry Pollard is a 66 y.o. male who presents to the emergency department for evaluation and treatment of pain on the left side of his neck that started about 4 days ago.  He states that he slept in a recliner and when he awakened he had pain and was unable to turn his head without making the pain much worse.  He has used heat and ice and applied some Voltaren gel without any relief. Past Medical History:  Diagnosis Date  . Arthritis   . Chronic kidney disease   . Depression   . Diabetes mellitus without complication ([place])   . GERD (gastroesophageal reflux disease)   . Gout   . Hyperlipidemia     Patient Active Problem List   Diagnosis Date Noted  . Arthritis 07/03/2017  . BPH (benign prostatic hyperplasia) 07/03/2017  . Advanced care planning/counseling discussion 07/03/2017  . Gout 05/30/2016  . CKD stage 3 due to type 2 diabetes mellitus ([place]) 02/18/2016  . Type 2 diabetes mellitus with peripheral neuropathy (HCC)   . Hyperlipidemia   . Essential hypertension     Past Surgical History:  Procedure Laterality Date  . APPENDECTOMY    . CARPAL TUNNEL RELEASE Right   . CHOLECYSTECTOMY    . SPINE SURGERY      Prior to Admission medications   Medication Sig Start Date End Date Taking? Authorizing Provider  allopurinol (ZYLOPRIM) 100 MG tablet Take 1 tablet (100 mg total) by mouth daily. 07/19/17   Johnson, Megan P, DO  amLODipine (NORVASC) 10 MG tablet Take 1 tablet (10 mg total) by mouth daily. 07/03/17   Guadalupe Maple, MD  benazepril (LOTENSIN) 40 MG tablet Take 1 tablet (40 mg total) by mouth daily. 07/03/17   Guadalupe Maple, MD  colchicine 0.6 MG tablet Take 1 tablet (0.6 mg total) by mouth daily. 05/30/16    Guadalupe Maple, MD  cyclobenzaprine (FLEXERIL) 10 MG tablet Take 1 tablet (10 mg total) by mouth 3 (three) times daily as needed for muscle spasms. 11/20/17   Tremaine Fuhriman, Johnette Abraham B, FNP  diclofenac sodium (VOLTAREN) 1 % GEL 4 GMS GEL FOUR TIMES DAILY TRANSDERMAL 06/29/16   Volney American, PA-C  fexofenadine (ALLEGRA) 180 MG tablet Take 180 mg by mouth daily.    [provider]  fluticasone (FLONASE) 50 MCG/ACT nasal spray Place 2 sprays into both nostrils daily. 07/03/17   Guadalupe Maple, MD  gabapentin (NEURONTIN) 300 MG capsule Take 1 capsule (300 mg total) by mouth 2 (two) times daily. 07/03/17   Guadalupe Maple, MD  gemfibrozil (LOPID) 600 MG tablet TAKE 1 TABLET (600 MG TOTAL) BY MOUTH 2 (TWO) TIMES DAILY BEFORE A MEAL. 06/29/17   Johnson, Megan P, DO  ibuprofen (IBU) 600 MG tablet Take 1 tablet (600 mg total) by mouth every 6 (six) hours as needed. 11/20/17   Mukund Weinreb, Johnette Abraham B, FNP  insulin aspart (NOVOLOG FLEXPEN) 100 UNIT/ML FlexPen INJECT 50-60 UNITS UNDER THE SKIN 3 TIMES A DAY 05/30/16   Crissman, Jeannette How, MD  LEVEMIR FLEXTOUCH 100 UNIT/ML Pen INJECT 50 UNITS INTO THE SKIN DAILY AT 10 PM. 06/11/17   Guadalupe Maple, MD  metFORMIN (GLUCOPHAGE-XR) 500 MG 24 hr tablet Take 500 mg by mouth 3 (three) times daily.  [provider]  NOVOLOG FLEXPEN 100 UNIT/ML FlexPen INJECT 50-60 UNITS UNDER THE SKIN 3 TIMES A DAY 12/22/16   Guadalupe Maple, MD  ONE TOUCH ULTRA TEST test strip USE AS DIRECTED 07/06/15   Guadalupe Maple, MD  pantoprazole (PROTONIX) 40 MG tablet Take 1 tablet (40 mg total) by mouth daily. 07/03/17   Guadalupe Maple, MD  traMADol (ULTRAM) 50 MG tablet Take 1 tablet (50 mg total) by mouth daily as needed. 07/03/17   Guadalupe Maple, MD  ULORIC 80 MG TABS Take 1 tablet (80 mg total) by mouth daily. 07/03/17   Guadalupe Maple, MD    Allergies Crestor [rosuvastatin]  Family History  Problem Relation Age of Onset  . Hypertension Mother   . Cancer Mother   .  Cancer Father   . Heart attack Brother     Social History Social History   Tobacco Use  . Smoking status: Former Smoker    Types: Cigarettes    Last attempt to quit: 07/12/1997    Years since quitting: 20.3  . Smokeless tobacco: Never Used  Substance Use Topics  . Alcohol use: No  . Drug use: No    Review of Systems Constitutional: Negative for recent illness or injury. Cardiovascular: Negative for chest pain Respiratory: Negative for shortness of breath Musculoskeletal: Positive for pain in the left side of his neck Skin: Negative for rash, lesion, or wound. Neurological: Negative for paresthesias or radicular pain  ____________________________________________   PHYSICAL EXAM:  VITAL SIGNS: ED Triage Vitals  Enc Vitals Group     BP 11/20/17 1534 140/67     Pulse Rate 11/20/17 1534 81     Resp 11/20/17 1534 18     Temp 11/20/17 1534 97.9 F (36.6 C)     Temp Source 11/20/17 1534 Oral     SpO2 11/20/17 1534 95 %     Weight 11/20/17 1535 250 lb (113.4 kg)     Height 11/20/17 1535 6\' 1"  (1.854 m)     Head Circumference --      Peak Flow --      Pain Score 11/20/17 1534 10     Pain Loc --      Pain Edu? --      Excl. in [place]? --     Constitutional: Alert and oriented. Well appearing and in no acute distress. Eyes: Conjunctivae are clear without discharge or drainage Head: Atraumatic Neck: Focal tenderness elicited on palpation over the left trapezius muscle extending from just behind the left ear down to the superior aspect of left shoulder. Respiratory: Breath sounds are clear and equal bilaterally Musculoskeletal: Limited range of motion of his neck secondary to Neurologic: Grip strength is equal bilaterally.  Cranial nerves II through XII normal as tested. Skin: Intact, warm, dry without rash, lesion, or wound. Psychiatric: Affect and behavior are normal  ____________________________________________   LABS (all labs ordered are listed, but only abnormal  results are displayed)  Labs Reviewed - No data to display ____________________________________________  RADIOLOGY  Not indicated ____________________________________________   PROCEDURES  Procedures  ____________________________________________   INITIAL IMPRESSION / ASSESSMENT AND PLAN / ED COURSE  Jerry Pollard is a 66 y.o. male who presents to the emergency department for treatment and evaluation of the left side neck pain.  Symptoms and exam are consistent with acute torticollis and will be treated with Toradol IM while here in the department.  He will be given a prescription for ibuprofen and Flexeril.  He will be instructed to follow-up with his primary care provider for symptoms that are not improving over the next couple of days.  He was instructed to return to the emergency department immediately for symptoms of change or worsen if he is unable to schedule an appointment.  Medications  ketorolac (TORADOL) 30 MG/ML injection 30 mg (not administered)    Pertinent labs & imaging results that were available during my care of the patient were reviewed by me and considered in my medical decision making (see chart for details).  _________________________________________   FINAL CLINICAL IMPRESSION(S) / ED DIAGNOSES  Final diagnoses:  Trapezius muscle spasm    ED Discharge Orders        Ordered    cyclobenzaprine (FLEXERIL) 10 MG tablet  3 times daily PRN     11/20/17 1635    ibuprofen (IBU) 600 MG tablet  Every 6 hours PRN     11/20/17 1635       If controlled substance prescribed during this visit, 12 month history viewed on the [place] prior to issuing an initial prescription for Schedule II or III opiod.    Victorino Dike, FNP 11/20/17 1643    Carrie Mew, MD 11/22/17 2325

## 2017-11-23 ENCOUNTER — Encounter: Payer: Self-pay | Admitting: Family Medicine

## 2017-11-23 ENCOUNTER — Ambulatory Visit: Payer: PPO | Admitting: Family Medicine

## 2017-11-23 DIAGNOSIS — M436 Torticollis: Secondary | ICD-10-CM

## 2017-11-23 DIAGNOSIS — E1142 Type 2 diabetes mellitus with diabetic polyneuropathy: Secondary | ICD-10-CM

## 2017-11-23 MED ORDER — MELOXICAM 15 MG PO TABS: 15.0000 mg | ORAL_TABLET | Freq: Every day | ORAL | 0 refills | Status: DC

## 2017-11-23 NOTE — Assessment & Plan Note (Signed)
Discussed torticollis care and treatment stretching and massage recommended massage therapy use of meloxicam limited because of renal function and given this time.

## 2017-11-23 NOTE — Assessment & Plan Note (Signed)
The current medical regimen is effective;  continue present plan and medications.  

## 2017-11-23 NOTE — Progress Notes (Signed)
BP 135/79   Pulse 93   Wt 249 lb (112.9 kg)   SpO2 98%   BMI 32.85 kg/m    Subjective:    Patient ID: Jerry Pollard, male    DOB: 12/26/1950, 66 y.o.   MRN: [id number]  HPI: Jerry Pollard is a 66 y.o. male  Chief Complaint  Patient presents with  . Neck Pain  rreviewed patient with severe neck pain and torticollistreated and released from the emergency room 4 days agopain has improved dramaticallysince then but is stillcontinuing. o numbness tingling in his hands or feet. eviewed kidney status with patient which has improveddiscuss use of nonsteroidals for his neck. Blood pressures done well in spite of the stress also diabetes is doing very well.  Relevant past medical, surgical, family and social history reviewed and updated as indicated. Interim medical history since our last visit reviewed. Allergies and medications reviewed and updated.  Review of Systems  Constitutional: Negative.   Respiratory: Negative.   Cardiovascular: Negative.     Per HPI unless specifically indicated above     Objective:    BP 135/79   Pulse 93   Wt 249 lb (112.9 kg)   SpO2 98%   BMI 32.85 kg/m   Wt Readings from Last 3 Encounters:  11/23/17 249 lb (112.9 kg)  11/20/17 250 lb (113.4 kg)  07/03/17 257 lb (116.6 kg)    Physical Exam  Constitutional: He is oriented to person, place, and time. He appears well-developed and well-nourished.  HENT:  Head: Normocephalic and atraumatic.  Eyes: Conjunctivae and EOM are normal.  Neck: Normal range of motion.  Cardiovascular: Normal rate, regular rhythm and normal heart sounds.  Pulmonary/Chest: Effort normal and breath sounds normal.  Musculoskeletal: Normal range of motion.  Marked spasming of left neck area  Neurological: He is alert and oriented to person, place, and time.  Skin: No erythema.  Psychiatric: He has a normal mood and affect. His behavior is normal. Judgment and thought content normal.    Results for orders placed or  performed in visit on 07/03/17  Microscopic Examination  Result Value Ref Range   WBC, UA 0-5 0 - 5 /hpf   RBC, UA 0-2 0 - 2 /hpf   Epithelial Cells (non renal) 0-10 0 - 10 /hpf   Casts Present None seen /lpf   Cast Type Hyaline casts N/A   Mucus, UA Present Not Estab.  Uric acid  Result Value Ref Range   Uric Acid 6.1 3.7 - 8.6 mg/dL  CBC with Differential/Platelet  Result Value Ref Range   WBC 5.9 3.4 - 10.8 x10E3/uL   RBC 4.64 4.14 - 5.80 x10E6/uL   Hemoglobin 13.6 13.0 - 17.7 g/dL   Hematocrit 40.9 37.5 - 51.0 %   MCV 88 79 - 97 fL   MCH 29.3 26.6 - 33.0 pg   MCHC 33.3 31.5 - 35.7 g/dL   RDW 14.5 12.3 - 15.4 %   Platelets 250 150 - 379 x10E3/uL   Neutrophils 66 Not Estab. %   Lymphs 24 Not Estab. %   Monocytes 6 Not Estab. %   Eos 3 Not Estab. %   Basos 1 Not Estab. %   Neutrophils Absolute 3.9 1.4 - 7.0 x10E3/uL   Lymphocytes Absolute 1.4 0.7 - 3.1 x10E3/uL   Monocytes Absolute 0.4 0.1 - 0.9 x10E3/uL   EOS (ABSOLUTE) 0.2 0.0 - 0.4 x10E3/uL   Basophils Absolute 0.0 0.0 - 0.2 x10E3/uL   Immature Granulocytes 0 Not  Estab. %   Immature Grans (Abs) 0.0 0.0 - 0.1 x10E3/uL  Comprehensive metabolic panel  Result Value Ref Range   Glucose 115 (H) 65 - 99 mg/dL   BUN 27 8 - 27 mg/dL   Creatinine, Ser 1.27 0.76 - 1.27 mg/dL   GFR calc non Af Amer 58 (L) >59 mL/min/1.73   GFR calc Af Amer 68 >59 mL/min/1.73   BUN/Creatinine Ratio 21 10 - 24   Sodium 141 134 - 144 mmol/L   Potassium 4.7 3.5 - 5.2 mmol/L   Chloride 102 96 - 106 mmol/L   CO2 20 20 - 29 mmol/L   Calcium 10.0 8.6 - 10.2 mg/dL   Total Protein 7.1 6.0 - 8.5 g/dL   Albumin 4.9 (H) 3.6 - 4.8 g/dL   Globulin, Total 2.2 1.5 - 4.5 g/dL   Albumin/Globulin Ratio 2.2 1.2 - 2.2   Bilirubin Total 0.3 0.0 - 1.2 mg/dL   Alkaline Phosphatase 84 39 - 117 IU/L   AST 20 0 - 40 IU/L   ALT 17 0 - 44 IU/L  Lipid panel  Result Value Ref Range   Cholesterol, Total 190 100 - 199 mg/dL   Triglycerides 229 (H) 0 - 149 mg/dL    HDL 34 (L) >39 mg/dL   VLDL Cholesterol Cal 46 (H) 5 - 40 mg/dL   LDL Calculated 110 (H) 0 - 99 mg/dL   Chol/HDL Ratio 5.6 (H) 0.0 - 5.0 ratio  PSA  Result Value Ref Range   Prostate Specific Ag, Serum 2.9 0.0 - 4.0 ng/mL  TSH  Result Value Ref Range   TSH 4.430 0.450 - 4.500 uIU/mL  Urinalysis, Routine w reflex microscopic  Result Value Ref Range   Specific Gravity, UA 1.020 1.005 - 1.030   pH, UA 5.0 5.0 - 7.5   Color, UA Yellow Yellow   Appearance Ur Clear Clear   Leukocytes, UA Trace (A) Negative   Protein, UA 2+ (A) Negative/Trace   Glucose, UA Negative Negative   Ketones, UA Trace (A) Negative   RBC, UA Negative Negative   Bilirubin, UA Negative Negative   Urobilinogen, Ur 0.2 0.2 - 1.0 mg/dL   Nitrite, UA Negative Negative   Microscopic Examination See below:       Assessment & Plan:   Problem List Items Addressed This Visit      Endocrine   Type 2 diabetes mellitus with peripheral neuropathy (HCC)    The current medical regimen is effective;  continue present plan and medications.         Musculoskeletal and Integument   Torticollis    Discussed torticollis care and treatment stretching and massage recommended massage therapy use of meloxicam limited because of renal function and given this time.          Follow up plan: Return if symptoms worsen or fail to improve, for As scheduled.

## 2017-11-28 ENCOUNTER — Other Ambulatory Visit: Payer: Self-pay | Admitting: Family Medicine

## 2017-12-07 ENCOUNTER — Telehealth: Payer: Self-pay | Admitting: Family Medicine

## 2017-12-07 NOTE — Telephone Encounter (Signed)
Patient needs appointment, Dr. Jeananne Rama is out of the office.

## 2017-12-07 NOTE — Telephone Encounter (Signed)
Called patient to see if he would like to schedule appointment with another provider since Dr Jeananne Rama is out. He states he may be some better and will call back if he feels he needs to be seen.

## 2017-12-07 NOTE — Telephone Encounter (Signed)
Copied from [place]. Topic: Quick Communication - See Telephone Encounter >> Dec 07, 2017 11:16 AM Robina Ade, Helene Kelp D wrote: CRM for notification. See Telephone encounter for: 12/07/17. Patient called wanting to talk to Dr. Jeananne Rama or his CMA about calling him something for sinus infection. Please call patient back, thanks.

## 2017-12-20 ENCOUNTER — Other Ambulatory Visit: Payer: Self-pay | Admitting: Family Medicine

## 2017-12-29 DIAGNOSIS — Z794 Long term (current) use of insulin: Secondary | ICD-10-CM | POA: Diagnosis not present

## 2017-12-29 DIAGNOSIS — E1165 Type 2 diabetes mellitus with hyperglycemia: Secondary | ICD-10-CM | POA: Diagnosis not present

## 2018-01-01 ENCOUNTER — Telehealth: Payer: Self-pay | Admitting: Family Medicine

## 2018-01-01 MED ORDER — BENZONATATE 100 MG PO CAPS: 100.0000 mg | ORAL_CAPSULE | Freq: Two times a day (BID) | ORAL | 0 refills | Status: DC | PRN

## 2018-01-01 MED ORDER — AMOXICILLIN 875 MG PO TABS: 875.0000 mg | ORAL_TABLET | Freq: Two times a day (BID) | ORAL | 0 refills | Status: DC

## 2018-01-01 NOTE — Addendum Note (Signed)
Addended by: Golden Pop A on: 01/01/2018 05:00 PM   Modules accepted: Orders

## 2018-01-01 NOTE — Telephone Encounter (Signed)
Copied from [place] [id number]. Topic: General - Other >> Jan 01, 2018  2:56 PM Conception Chancy, NT wrote: Patient states he believes he has a sinus infection and would like something called into his POF. He has drainage and a sinus headache. Patient has tried taking over the counter medicine but its not clearing it up.

## 2018-01-03 ENCOUNTER — Encounter: Payer: Self-pay | Admitting: Family Medicine

## 2018-01-03 ENCOUNTER — Ambulatory Visit (INDEPENDENT_AMBULATORY_CARE_PROVIDER_SITE_OTHER): Payer: PPO | Admitting: Family Medicine

## 2018-01-03 VITALS — BP 125/69 | HR 92 | Temp 97.7°F | Wt 265.0 lb

## 2018-01-03 DIAGNOSIS — J019 Acute sinusitis, unspecified: Secondary | ICD-10-CM | POA: Diagnosis not present

## 2018-01-03 DIAGNOSIS — E1142 Type 2 diabetes mellitus with diabetic polyneuropathy: Secondary | ICD-10-CM

## 2018-01-03 DIAGNOSIS — E785 Hyperlipidemia, unspecified: Secondary | ICD-10-CM | POA: Diagnosis not present

## 2018-01-03 DIAGNOSIS — I10 Essential (primary) hypertension: Secondary | ICD-10-CM | POA: Diagnosis not present

## 2018-01-03 LAB — LP+ALT+AST PICCOLO, WAIVED
ALT (SGPT) PICCOLO, WAIVED: 36 U/L (ref 10–47)
AST (SGOT) Piccolo, Waived: 25 U/L (ref 11–38)
Chol/HDL Ratio Piccolo,Waive: 4.9 mg/dL
Cholesterol Piccolo, Waived: 202 mg/dL — ABNORMAL HIGH (ref <200)
HDL Chol Piccolo, Waived: 42 mg/dL — ABNORMAL LOW (ref >59)
LDL CHOL CALC PICCOLO WAIVED: 110 mg/dL — AB (ref <100)
Triglycerides Piccolo,Waived: 254 mg/dL — ABNORMAL HIGH (ref <150)
VLDL CHOL CALC PICCOLO,WAIVE: 51 mg/dL — AB (ref <30)

## 2018-01-03 MED ORDER — ATORVASTATIN CALCIUM 10 MG PO TABS: 10.0000 mg | ORAL_TABLET | Freq: Every day | ORAL | 3 refills | Status: DC

## 2018-01-03 MED ORDER — HYDROCOD POLST-CPM POLST ER 10-8 MG/5ML PO SUER: 2.5000 mL | Freq: Two times a day (BID) | ORAL | 0 refills | Status: DC | PRN

## 2018-01-03 NOTE — Progress Notes (Signed)
BP 125/69 (BP Location: Left Arm, Patient Position: Sitting, Cuff Size: Normal)   Pulse 92   Temp 97.7 F (36.5 C) (Oral)   Wt 265 lb (120.2 kg)   SpO2 95%   BMI 34.96 kg/m    Subjective:    Patient ID: Jerry Pollard, male    DOB: 08/07/1951, 67 y.o.   MRN: [id number]  HPI: Jerry Pollard is a 67 y.o. male  Chief Complaint  Patient presents with  . Hypertension  . Hyperlipidemia   Patient follow-up hypertension hypercholesterol.  Patient unfortunately has had a sinus infection which started as a classic viral infection then progressed to sinusitis has been placed on amoxicillin and just this may be starting to make some slight improvement after couple days on this.  Has used Tessalon which is not doing much for his cough once may be some Tussionex or something stronger to help. Taking blood pressure cholesterol without problems or side effects Relevant past medical, surgical, family and social history reviewed and updated as indicated. Interim medical history since our last visit reviewed. Allergies and medications reviewed and updated.  Review of Systems  Constitutional: Negative.   Respiratory: Negative.   Cardiovascular: Negative.     Per HPI unless specifically indicated above     Objective:    BP 125/69 (BP Location: Left Arm, Patient Position: Sitting, Cuff Size: Normal)   Pulse 92   Temp 97.7 F (36.5 C) (Oral)   Wt 265 lb (120.2 kg)   SpO2 95%   BMI 34.96 kg/m   Wt Readings from Last 3 Encounters:  01/03/18 265 lb (120.2 kg)  11/23/17 249 lb (112.9 kg)  11/20/17 250 lb (113.4 kg)    Physical Exam  Constitutional: He is oriented to person, place, and time. He appears well-developed and well-nourished.  HENT:  Head: Normocephalic and atraumatic.  Eyes: Conjunctivae and EOM are normal.  Neck: Normal range of motion.  Cardiovascular: Normal rate, regular rhythm and normal heart sounds.  Pulmonary/Chest: Effort normal and breath sounds normal.    Musculoskeletal: Normal range of motion.  Neurological: He is alert and oriented to person, place, and time.  Skin: No erythema.  Psychiatric: He has a normal mood and affect. His behavior is normal. Judgment and thought content normal.    Results for orders placed or performed in visit on 01/03/18  HM COLONOSCOPY  Result Value Ref Range   HM Colonoscopy See Report (in chart) See Report (in chart), Patient Reported      Assessment & Plan:   Problem List Items Addressed This Visit      Cardiovascular and Mediastinum   Essential hypertension - Primary    The current medical regimen is effective;  continue present plan and medications.       Relevant Medications   atorvastatin (LIPITOR) 10 MG tablet   Other Relevant Orders   LP+ALT+AST Piccolo, Waived     Endocrine   Type 2 diabetes mellitus with peripheral neuropathy ([place])    Followed by endocrinology discussed how bad this is doing.  Also mentioned he is aging in dog years contributes to his multiple feeling bad.      Relevant Medications   atorvastatin (LIPITOR) 10 MG tablet   Other Relevant Orders   LP+ALT+AST Piccolo, Waived     Other   Hyperlipidemia   Relevant Medications   atorvastatin (LIPITOR) 10 MG tablet   Other Relevant Orders   LP+ALT+AST Piccolo, Waived    Other Visit Diagnoses  Acute sinusitis, recurrence not specified, unspecified location       Relevant Medications   chlorpheniramine-HYDROcodone (TUSSIONEX PENNKINETIC ER) 10-8 MG/5ML SUER    Discussed sinusitis will give some Tussionex also discussed risks benefits and cautions  Follow up plan: Return in about 6 months (around 07/03/2018) for Physical Exam.

## 2018-01-03 NOTE — Assessment & Plan Note (Signed)
The current medical regimen is effective;  continue present plan and medications.  

## 2018-01-03 NOTE — Assessment & Plan Note (Addendum)
Followed by endocrinology discussed how bad this is doing.  Also mentioned he is aging in dog years contributes to his multiple feeling bad.

## 2018-01-05 DIAGNOSIS — E1165 Type 2 diabetes mellitus with hyperglycemia: Secondary | ICD-10-CM | POA: Diagnosis not present

## 2018-01-05 DIAGNOSIS — Z794 Long term (current) use of insulin: Secondary | ICD-10-CM | POA: Diagnosis not present

## 2018-01-05 DIAGNOSIS — R809 Proteinuria, unspecified: Secondary | ICD-10-CM | POA: Diagnosis not present

## 2018-01-05 DIAGNOSIS — E1129 Type 2 diabetes mellitus with other diabetic kidney complication: Secondary | ICD-10-CM | POA: Diagnosis not present

## 2018-01-11 ENCOUNTER — Other Ambulatory Visit
Admission: RE | Admit: 2018-01-11 | Discharge: 2018-01-11 | Disposition: A | Discharge status: Home / Self Care | Payer: PPO | Source: Ambulatory Visit | Attending: Rheumatology | Admitting: Rheumatology

## 2018-01-11 DIAGNOSIS — M7022 Olecranon bursitis, left elbow: Secondary | ICD-10-CM | POA: Diagnosis not present

## 2018-01-11 DIAGNOSIS — M1A00X0 Idiopathic chronic gout, unspecified site, without tophus (tophi): Secondary | ICD-10-CM | POA: Diagnosis not present

## 2018-01-11 LAB — SYNOVIAL CELL COUNT + DIFF, W/ CRYSTALS
Eosinophils-Synovial: 1 %
Lymphocytes-Synovial Fld: 8 %
MONOCYTE-MACROPHAGE-SYNOVIAL FLUID: 31 %
Neutrophil, Synovial: 60 %
Other Cells-SYN: 0
WBC, Synovial: 3239 /mm3 — ABNORMAL HIGH (ref 0–200)

## 2018-01-15 LAB — BODY FLUID CULTURE
CULTURE: NO GROWTH
Gram Stain: NONE SEEN

## 2018-02-01 ENCOUNTER — Ambulatory Visit (INDEPENDENT_AMBULATORY_CARE_PROVIDER_SITE_OTHER): Payer: PPO | Admitting: Family Medicine

## 2018-02-01 ENCOUNTER — Encounter: Payer: Self-pay | Admitting: Family Medicine

## 2018-02-01 DIAGNOSIS — N183 Chronic kidney disease, stage 3 unspecified: Secondary | ICD-10-CM

## 2018-02-01 DIAGNOSIS — E785 Hyperlipidemia, unspecified: Secondary | ICD-10-CM

## 2018-02-01 DIAGNOSIS — E1122 Type 2 diabetes mellitus with diabetic chronic kidney disease: Secondary | ICD-10-CM

## 2018-02-01 DIAGNOSIS — I10 Essential (primary) hypertension: Secondary | ICD-10-CM

## 2018-02-01 NOTE — Assessment & Plan Note (Signed)
Doing well on atorvastatin with no side effects

## 2018-02-01 NOTE — Progress Notes (Signed)
   BP 128/84   Pulse 98   Ht 6' (1.829 m)   Wt 265 lb (120.2 kg)   SpO2 98%   BMI 35.94 kg/m    Subjective:    Patient ID: Jerry Pollard, male    DOB: [birth year], 67 y.o.   MRN: [id number]  HPI: Jerry Pollard is a 67 y.o. male  Recheck cholesterol. Patient placed on atorvastatin and taking now without problems has been working with endocrinology and actually working on diet eating better blood sugars are coming down to the lower 100 range.  No low blood sugar spells. No issues with medications still having some arthralgias aches and pains had left olecranon bursitis treated by Dr. Jefm Bryant which is resolved and doing well.  Relevant past medical, surgical, family and social history reviewed and updated as indicated. Interim medical history since our last visit reviewed. Allergies and medications reviewed and updated.  Review of Systems  Constitutional: Negative.   Respiratory: Negative.   Cardiovascular: Negative.     Per HPI unless specifically indicated above     Objective:    BP 128/84   Pulse 98   Ht 6' (1.829 m)   Wt 265 lb (120.2 kg)   SpO2 98%   BMI 35.94 kg/m   Wt Readings from Last 3 Encounters:  02/01/18 265 lb (120.2 kg)  01/03/18 265 lb (120.2 kg)  11/23/17 249 lb (112.9 kg)    Physical Exam  Constitutional: He is oriented to person, place, and time. He appears well-developed and well-nourished.  HENT:  Head: Normocephalic and atraumatic.  Eyes: Conjunctivae and EOM are normal.  Neck: Normal range of motion.  Cardiovascular: Normal rate, regular rhythm and normal heart sounds.  Pulmonary/Chest: Effort normal and breath sounds normal.  Musculoskeletal: Normal range of motion.  Neurological: He is alert and oriented to person, place, and time.  Skin: No erythema.  Psychiatric: He has a normal mood and affect. His behavior is normal. Judgment and thought content normal.        Assessment & Plan:   Problem List Items Addressed This Visit      Cardiovascular and Mediastinum   Essential hypertension    The current medical regimen is effective;  continue present plan and medications.         Endocrine   CKD stage 3 due to type 2 diabetes mellitus ([place])    Getting better followed by endocrinology        Other   Hyperlipidemia    Doing well on atorvastatin with no side effects          Follow up plan: Return in about 2 months (around 04/01/2018) for  Lipids, ALT, AST.

## 2018-02-01 NOTE — Assessment & Plan Note (Signed)
The current medical regimen is effective;  continue present plan and medications.  

## 2018-02-01 NOTE — Assessment & Plan Note (Signed)
Getting better followed by endocrinology

## 2018-03-06 ENCOUNTER — Other Ambulatory Visit: Payer: Self-pay | Admitting: Family Medicine

## 2018-04-02 DIAGNOSIS — Z794 Long term (current) use of insulin: Secondary | ICD-10-CM | POA: Diagnosis not present

## 2018-04-02 DIAGNOSIS — E1165 Type 2 diabetes mellitus with hyperglycemia: Secondary | ICD-10-CM | POA: Diagnosis not present

## 2018-04-10 DIAGNOSIS — E1129 Type 2 diabetes mellitus with other diabetic kidney complication: Secondary | ICD-10-CM | POA: Diagnosis not present

## 2018-04-10 DIAGNOSIS — Z794 Long term (current) use of insulin: Secondary | ICD-10-CM | POA: Diagnosis not present

## 2018-04-10 DIAGNOSIS — R809 Proteinuria, unspecified: Secondary | ICD-10-CM | POA: Diagnosis not present

## 2018-04-11 ENCOUNTER — Encounter: Payer: Self-pay | Admitting: Family Medicine

## 2018-04-11 ENCOUNTER — Ambulatory Visit (INDEPENDENT_AMBULATORY_CARE_PROVIDER_SITE_OTHER): Payer: PPO | Admitting: Family Medicine

## 2018-04-11 VITALS — BP 136/78 | HR 84 | Ht 72.0 in | Wt 269.0 lb

## 2018-04-11 DIAGNOSIS — I10 Essential (primary) hypertension: Secondary | ICD-10-CM | POA: Diagnosis not present

## 2018-04-11 DIAGNOSIS — E785 Hyperlipidemia, unspecified: Secondary | ICD-10-CM

## 2018-04-11 DIAGNOSIS — E1142 Type 2 diabetes mellitus with diabetic polyneuropathy: Secondary | ICD-10-CM

## 2018-04-11 LAB — LP+ALT+AST PICCOLO, WAIVED
ALT (SGPT) Piccolo, Waived: 70 U/L — ABNORMAL HIGH (ref 10–47)
AST (SGOT) Piccolo, Waived: 50 U/L — ABNORMAL HIGH (ref 11–38)
CHOLESTEROL PICCOLO, WAIVED: 155 mg/dL (ref <200)
Chol/HDL Ratio Piccolo,Waive: 3.2 mg/dL
HDL Chol Piccolo, Waived: 48 mg/dL — ABNORMAL LOW (ref >59)
LDL CHOL CALC PICCOLO WAIVED: 49 mg/dL (ref <100)
Triglycerides Piccolo,Waived: 290 mg/dL — ABNORMAL HIGH (ref <150)
VLDL CHOL CALC PICCOLO,WAIVE: 58 mg/dL — AB (ref <30)

## 2018-04-11 NOTE — Progress Notes (Signed)
   BP 136/78 (BP Location: Left Arm)   Pulse 84   Ht 6' (1.829 m)   Wt 269 lb (122 kg)   SpO2 96%   BMI 36.48 kg/m    Subjective:    Patient ID: Jerry Pollard, male    DOB: Jul 22, 1951, 67 y.o.   MRN: [id number]  HPI: Jerry Pollard is a 67 y.o. male  Chief Complaint  Patient presents with  . Hyperlipidemia   Patient follow-up hypercholesterol taking atorvastatin 10 mg without problems or issues taking faithfully.  Has not been able to work on low-cholesterol carbohydrate diet. Diabetes doing well has been followed by endocrinology and doing so well that endocrinology has returned patient to here for his diabetes control.  Encouraged continued insulin with encouraged lower carbohydrate diet with weight loss.  And continue current medications. Blood pressure also doing well. Relevant past medical, surgical, family and social history reviewed and updated as indicated. Interim medical history since our last visit reviewed. Allergies and medications reviewed and updated.  Review of Systems  Constitutional: Negative.   Respiratory: Negative.   Cardiovascular: Negative.      Per HPI unless specifically indicated above     Objective:    BP 136/78 (BP Location: Left Arm)   Pulse 84   Ht 6' (1.829 m)   Wt 269 lb (122 kg)   SpO2 96%   BMI 36.48 kg/m   Wt Readings from Last 3 Encounters:  04/11/18 269 lb (122 kg)  02/01/18 265 lb (120.2 kg)  01/03/18 265 lb (120.2 kg)    Physical Exam  Constitutional: He is oriented to person, place, and time. He appears well-developed and well-nourished.  HENT:  Head: Normocephalic and atraumatic.  Eyes: Conjunctivae and EOM are normal.  Neck: Normal range of motion.  Cardiovascular: Normal rate, regular rhythm and normal heart sounds.  Pulmonary/Chest: Effort normal and breath sounds normal.  Musculoskeletal: Normal range of motion.  Neurological: He is alert and oriented to person, place, and time.  Skin: No erythema.  Psychiatric: He  has a normal mood and affect. His behavior is normal. Judgment and thought content normal.        Assessment & Plan:   Problem List Items Addressed This Visit      Cardiovascular and Mediastinum   Essential hypertension - Primary    The current medical regimen is effective;  continue present plan and medications.       Relevant Orders   LP+ALT+AST Piccolo, [place]     Endocrine   Type 2 diabetes mellitus with peripheral neuropathy ([place])    Reviewed diabetes patient doing very well with standard dosing will continue current dosing on medications and endocrinology is passed patient back for our care will reevaluate next visit this summer.      Relevant Orders   LP+ALT+AST Piccolo, Waived     Other   Hyperlipidemia    Discuss elevated cholesterol markedly improved and doing well on Lipitor 10 mg once a day. Unfortunately liver enzymes have gone up marginally. As a consequence will decrease Lipitor to 10 mg every other day and recheck liver enzymes at physical this summer.      Relevant Orders   LP+ALT+AST Piccolo, Waived       Follow up plan: Return in about 3 months (around 07/12/2018) for Hemoglobin A1c, Physical Exam.

## 2018-04-11 NOTE — Assessment & Plan Note (Signed)
The current medical regimen is effective;  continue present plan and medications.  

## 2018-04-11 NOTE — Assessment & Plan Note (Signed)
Discuss elevated cholesterol markedly improved and doing well on Lipitor 10 mg once a day. Unfortunately liver enzymes have gone up marginally. As a consequence will decrease Lipitor to 10 mg every other day and recheck liver enzymes at physical this summer.

## 2018-04-11 NOTE — Assessment & Plan Note (Signed)
Reviewed diabetes patient doing very well with standard dosing will continue current dosing on medications and endocrinology is passed patient back for our care will reevaluate next visit this summer.

## 2018-06-28 ENCOUNTER — Ambulatory Visit: Payer: Self-pay

## 2018-06-29 ENCOUNTER — Ambulatory Visit (INDEPENDENT_AMBULATORY_CARE_PROVIDER_SITE_OTHER): Payer: PPO

## 2018-06-29 VITALS — BP 138/64 | HR 73 | Temp 98.7°F | Resp 17 | Ht 73.0 in | Wt 265.7 lb

## 2018-06-29 DIAGNOSIS — Z1211 Encounter for screening for malignant neoplasm of colon: Secondary | ICD-10-CM | POA: Diagnosis not present

## 2018-06-29 DIAGNOSIS — Z0000 Encounter for general adult medical examination without abnormal findings: Secondary | ICD-10-CM

## 2018-06-29 NOTE — Patient Instructions (Signed)
Jerry Pollard , Thank you for taking time to come for your Medicare Wellness Visit. I appreciate your ongoing commitment to your health goals. Please review the following plan we discussed and let me know if I can assist you in the future.   Screening recommendations/referrals: Colonoscopy: completed 09/06/2013  Recommended yearly ophthalmology/optometry visit for glaucoma screening and checkup Recommended yearly dental visit for hygiene and checkup  Vaccinations: Influenza vaccine: due 08/2018 Pneumococcal vaccine: completed series Tdap vaccine: up to date Shingles vaccine: shingrix eligible, check with your insurance company for coverage     Advanced directives: Please bring a copy of your health care power of attorney and living will to the office at your convenience.  Conditions/risks identified: recommend drinking at least 6-8 glasses of water a day   Next appointment: Follow up in one year for your annual wellness exam.   Preventive Care 65 Years and Older, Male Preventive care refers to lifestyle choices and visits with your health care provider that can promote health and wellness. What does preventive care include?  A yearly physical exam. This is also called an annual well check.  Dental exams once or twice a year.  Routine eye exams. Ask your health care provider how often you should have your eyes checked.  Personal lifestyle choices, including:  Daily care of your teeth and gums.  Regular physical activity.  Eating a healthy diet.  Avoiding tobacco and drug use.  Limiting alcohol use.  Practicing safe sex.  Taking low doses of aspirin every day.  Taking vitamin and mineral supplements as recommended by your health care provider. What happens during an annual well check? The services and screenings done by your health care provider during your annual well check will depend on your age, overall health, lifestyle risk factors, and family history of  disease. Counseling  Your health care provider may ask you questions about your:  Alcohol use.  Tobacco use.  Drug use.  Emotional well-being.  Home and relationship well-being.  Sexual activity.  Eating habits.  History of falls.  Memory and ability to understand (cognition).  Work and work Statistician. Screening  You may have the following tests or measurements:  Height, weight, and BMI.  Blood pressure.  Lipid and cholesterol levels. These may be checked every 5 years, or more frequently if you are over 90 years old.  Skin check.  Lung cancer screening. You may have this screening every year starting at age 30 if you have a 30-pack-year history of smoking and currently smoke or have quit within the past 15 years.  Fecal occult blood test (FOBT) of the stool. You may have this test every year starting at age 83.  Flexible sigmoidoscopy or colonoscopy. You may have a sigmoidoscopy every 5 years or a colonoscopy every 10 years starting at age 34.  Prostate cancer screening. Recommendations will vary depending on your family history and other risks.  Hepatitis C blood test.  Hepatitis B blood test.  Sexually transmitted disease (STD) testing.  Diabetes screening. This is done by checking your blood sugar (glucose) after you have not eaten for a while (fasting). You may have this done every 1-3 years.  Abdominal aortic aneurysm (AAA) screening. You may need this if you are a current or former smoker.  Osteoporosis. You may be screened starting at age 38 if you are at high risk. Talk with your health care provider about your test results, treatment options, and if necessary, the need for more tests. Vaccines  Your health care provider may recommend certain vaccines, such as:  Influenza vaccine. This is recommended every year.  Tetanus, diphtheria, and acellular pertussis (Tdap, Td) vaccine. You may need a Td booster every 10 years.  Zoster vaccine. You may  need this after age 54.  Pneumococcal 13-valent conjugate (PCV13) vaccine. One dose is recommended after age 45.  Pneumococcal polysaccharide (PPSV23) vaccine. One dose is recommended after age 101. Talk to your health care provider about which screenings and vaccines you need and how often you need them. This information is not intended to replace advice given to you by your health care provider. Make sure you discuss any questions you have with your health care provider. Document Released: 12/25/2015 Document Revised: 08/17/2016 Document Reviewed: 09/29/2015 Elsevier Interactive Patient Education  2017 [place] Prevention in the Home Falls can cause injuries. They can happen to people of all ages. There are many things you can do to make your home safe and to help prevent falls. What can I do on the outside of my home?  Regularly fix the edges of walkways and driveways and fix any cracks.  Remove anything that might make you trip as you walk through a door, such as a raised step or threshold.  Trim any bushes or trees on the path to your home.  Use bright outdoor lighting.  Clear any walking paths of anything that might make someone trip, such as rocks or tools.  Regularly check to see if handrails are loose or broken. Make sure that both sides of any steps have handrails.  Any raised decks and porches should have guardrails on the edges.  Have any leaves, snow, or ice cleared regularly.  Use sand or salt on walking paths during winter.  Clean up any spills in your garage right away. This includes oil or grease spills. What can I do in the bathroom?  Use night lights.  Install grab bars by the toilet and in the tub and shower. Do not use towel bars as grab bars.  Use non-skid mats or decals in the tub or shower.  If you need to sit down in the shower, use a plastic, non-slip stool.  Keep the floor dry. Clean up any water that spills on the floor as soon as it  happens.  Remove soap buildup in the tub or shower regularly.  Attach bath mats securely with double-sided non-slip rug tape.  Do not have throw rugs and other things on the floor that can make you trip. What can I do in the bedroom?  Use night lights.  Make sure that you have a light by your bed that is easy to reach.  Do not use any sheets or blankets that are too big for your bed. They should not hang down onto the floor.  Have a firm chair that has side arms. You can use this for support while you get dressed.  Do not have throw rugs and other things on the floor that can make you trip. What can I do in the kitchen?  Clean up any spills right away.  Avoid walking on wet floors.  Keep items that you use a lot in easy-to-reach places.  If you need to reach something above you, use a strong step stool that has a grab bar.  Keep electrical cords out of the way.  Do not use floor polish or wax that makes floors slippery. If you must use wax, use non-skid floor wax.  Do  not have throw rugs and other things on the floor that can make you trip. What can I do with my stairs?  Do not leave any items on the stairs.  Make sure that there are handrails on both sides of the stairs and use them. Fix handrails that are broken or loose. Make sure that handrails are as long as the stairways.  Check any carpeting to make sure that it is firmly attached to the stairs. Fix any carpet that is loose or worn.  Avoid having throw rugs at the top or bottom of the stairs. If you do have throw rugs, attach them to the floor with carpet tape.  Make sure that you have a light switch at the top of the stairs and the bottom of the stairs. If you do not have them, ask someone to add them for you. What else can I do to help prevent falls?  Wear shoes that:  Do not have high heels.  Have rubber bottoms.  Are comfortable and fit you well.  Are closed at the toe. Do not wear sandals.  If you  use a stepladder:  Make sure that it is fully opened. Do not climb a closed stepladder.  Make sure that both sides of the stepladder are locked into place.  Ask someone to hold it for you, if possible.  Clearly mark and make sure that you can see:  Any grab bars or handrails.  First and last steps.  Where the edge of each step is.  Use tools that help you move around (mobility aids) if they are needed. These include:  Canes.  Walkers.  Scooters.  Crutches.  Turn on the lights when you go into a dark area. Replace any light bulbs as soon as they burn out.  Set up your furniture so you have a clear path. Avoid moving your furniture around.  If any of your floors are uneven, fix them.  If there are any pets around you, be aware of where they are.  Review your medicines with your doctor. Some medicines can make you feel dizzy. This can increase your chance of falling. Ask your doctor what other things that you can do to help prevent falls. This information is not intended to replace advice given to you by your health care provider. Make sure you discuss any questions you have with your health care provider. Document Released: 09/24/2009 Document Revised: 05/05/2016 Document Reviewed: 01/02/2015 Elsevier Interactive Patient Education  2017 [employer].

## 2018-06-29 NOTE — Progress Notes (Signed)
Subjective:   Jerry Pollard is a 67 y.o. male who presents for Medicare Annual/Subsequent preventive examination.  Review of Systems:   Cardiac Risk Factors include: advanced age (>31men, >27 women);diabetes mellitus;dyslipidemia;hypertension;male gender;obesity (BMI >30kg/m2)     Objective:    Vitals: BP 138/64 (BP Location: Left Arm, Patient Position: Sitting)   Pulse 73   Temp 98.7 F (37.1 C) (Temporal)   Resp 17   Ht 6\' 1"  (1.854 m)   Wt 265 lb 11.2 oz (120.5 kg)   BMI 35.05 kg/m   Body mass index is 35.05 kg/m.  Advanced Directives 06/29/2018 11/20/2017 06/28/2017  Does Patient Have a Medical Advance Directive? Yes Yes Yes  Type of Advance Directive Living will;Healthcare Power of [place];Living will  Copy of [place] in Chart? No - copy requested - No - copy requested    Tobacco Social History   Tobacco Use  Smoking Status Former Smoker  . Types: Cigarettes  . Last attempt to quit: 07/12/1997  . Years since quitting: 20.9  Smokeless Tobacco Never Used     Counseling given: Not Answered   Clinical Intake:  Pre-visit preparation completed: Yes  Pain : 0-10 Pain Score: 2  Pain Location: Knee Pain Orientation: Right Pain Descriptors / Indicators: Aching Pain Onset: More than a month ago Pain Frequency: Constant     Nutritional Status: BMI > 30  Obese Nutritional Risks: None Diabetes: Yes CBG done?: No Did pt. bring in CBG monitor from home?: No  How often do you need to have someone help you when you read instructions, pamphlets, or other written materials from your doctor or pharmacy?: 1 - Never What is the last grade level you completed in school?: high school   Interpreter Needed?: No  Information entered by :: Jerry Tiberio,LPN   Past Medical History:  Diagnosis Date  . Arthritis   . Chronic kidney disease   . Depression   . Diabetes mellitus without complication ([place])   . GERD  (gastroesophageal reflux disease)   . Gout   . Hyperlipidemia    Past Surgical History:  Procedure Laterality Date  . APPENDECTOMY    . CARPAL TUNNEL RELEASE Right   . CHOLECYSTECTOMY    . SPINE SURGERY     Family History  Problem Relation Age of Onset  . Hypertension Mother   . Cancer Mother   . Cancer Father   . Heart attack Brother    Social History   Socioeconomic History  . Marital status: Single    Spouse name: Not on file  . Number of children: Not on file  . Years of education: Not on file  . Highest education level: High school graduate  Occupational History  . Not on file  Social Needs  . Financial resource strain: Not hard at all  . Food insecurity:    Worry: Never true    Inability: Never true  . Transportation needs:    Medical: No    Non-medical: No  Tobacco Use  . Smoking status: Former Smoker    Types: Cigarettes    Last attempt to quit: 07/12/1997    Years since quitting: 20.9  . Smokeless tobacco: Never Used  Substance and Sexual Activity  . Alcohol use: No  . Drug use: No  . Sexual activity: Not on file  Lifestyle  . Physical activity:    Days per week: 0 days    Minutes per session: 0 min  .  Stress: Not at all  Relationships  . Social connections:    Talks on phone: More than three times a week    Gets together: More than three times a week    Attends religious service: More than 4 times per year    Active member of club or organization: Yes    Attends meetings of clubs or organizations: More than 4 times per year    Relationship status: Not on file  Other Topics Concern  . Not on file  Social History Narrative   Working part time     Outpatient Encounter Medications as of 06/29/2018  Medication Sig  . allopurinol (ZYLOPRIM) 100 MG tablet Take 1 tablet (100 mg total) by mouth daily.  Marland Kitchen amLODipine (NORVASC) 10 MG tablet Take 1 tablet (10 mg total) by mouth daily.  Marland Kitchen atorvastatin (LIPITOR) 10 MG tablet Take 1 tablet (10 mg total) by  mouth daily.  . benazepril (LOTENSIN) 40 MG tablet Take 1 tablet (40 mg total) by mouth daily.  . colchicine 0.6 MG tablet Take 1 tablet (0.6 mg total) by mouth daily.  . diclofenac sodium (VOLTAREN) 1 % GEL APPLY 4 GMS OF GEL TO AFFECTED SKIN FOUR TIMES DAILY  . fexofenadine (ALLEGRA) 180 MG tablet Take 180 mg by mouth daily.  . fluticasone (FLONASE) 50 MCG/ACT nasal spray Place 2 sprays into both nostrils daily.  Marland Kitchen gabapentin (NEURONTIN) 300 MG capsule Take 1 capsule (300 mg total) by mouth 2 (two) times daily.  . insulin aspart (NOVOLOG FLEXPEN) 100 UNIT/ML FlexPen INJECT 50-60 UNITS UNDER THE SKIN 3 TIMES A DAY  . LEVEMIR FLEXTOUCH 100 UNIT/ML Pen INJECT 50 UNITS INTO THE SKIN DAILY AT 10 PM. (Patient taking differently: Inject 50 Units into the skin daily at 10 pm. Uses [facility])  . metFORMIN (GLUCOPHAGE-XR) 500 MG 24 hr tablet Take 500 mg by mouth 3 (three) times daily.  . ONE TOUCH ULTRA TEST test strip USE AS DIRECTED  . pantoprazole (PROTONIX) 40 MG tablet Take 1 tablet (40 mg total) by mouth daily.  . chlorpheniramine-HYDROcodone (TUSSIONEX PENNKINETIC ER) 10-8 MG/5ML SUER Take 2.5-5 mLs by mouth every 12 (twelve) hours as needed for cough. (Patient not taking: Reported on 06/29/2018)  . cyclobenzaprine (FLEXERIL) 10 MG tablet Take 1 tablet (10 mg total) by mouth 3 (three) times daily as needed for muscle spasms. (Patient not taking: Reported on 06/29/2018)  . traMADol (ULTRAM) 50 MG tablet Take 1 tablet (50 mg total) by mouth daily as needed. (Patient not taking: Reported on 06/29/2018)   No facility-administered encounter medications on file as of 06/29/2018.     Activities of Daily Living In your present state of health, do you have any difficulty performing the following activities: 06/29/2018  Hearing? N  Vision? N  Difficulty concentrating or making decisions? N  Walking or climbing stairs? Y  Comment knee pain   Dressing or bathing? N  Doing errands, shopping? N  Preparing Food and  eating ? N  Using the Toilet? N  In the past six months, have you accidently leaked urine? N  Do you have problems with loss of bowel control? N  Managing your Medications? N  Managing your Finances? N  Housekeeping or managing your Housekeeping? N  Some recent data might be hidden    Patient Care Team: Guadalupe Maple, MD as PCP - General (Family Medicine) Emmaline Kluver., MD (Rheumatology) Leanor Kail, MD (Orthopedic Surgery)   Assessment:   This is a routine wellness examination  for [employer].  Exercise Activities and Dietary recommendations Current Exercise Habits: The patient has a physically strenous job, but has no regular exercise apart from work., Exercise limited by: None identified  Goals    . DIET - INCREASE WATER INTAKE     recommend drinking at least 6-8 glasses of water a day        Fall Risk Fall Risk  06/29/2018 04/11/2018 02/01/2018 11/23/2017 06/28/2017  Falls in the past year? No No No No No  Number falls in past yr: - - - - -  Injury with Fall? - - - - -   Is the patient's home free of loose throw rugs in walkways, pet beds, electrical cords, etc?   no      Grab bars in the bathroom? no      Handrails on the stairs?   no      Adequate lighting?   yes  Timed Get Up and Go Performed: Completed in 8 seconds with no use of assistive devices, steady gait. No intervention needed at this time.   Depression Screen PHQ 2/9 Scores 06/29/2018 04/11/2018 02/01/2018 11/23/2017  PHQ - 2 Score 0 0 0 0    Cognitive Function     6CIT Screen 06/29/2018 06/28/2017  What Year? 0 points 0 points  What month? 0 points 0 points  What time? 0 points 0 points  Count back from 20 0 points 0 points  Months in reverse 0 points 0 points  Repeat phrase 2 points 2 points  Total Score 2 2    Immunization History  Administered Date(s) Administered  . Influenza, High Dose Seasonal PF 09/11/2017  . Influenza-Unspecified 09/16/2014, 09/08/2015, 08/13/2016  . Pneumococcal  Conjugate-13 05/30/2016  . Pneumococcal Polysaccharide-23 06/28/2017  . Td 09/13/2005  . Tdap 05/30/2016    Qualifies for Shingles Vaccine? Yes, discussed shingrix vaccine   Screening Tests Health Maintenance  Topic Date Due  . HEMOGLOBIN A1C  09/15/2017  . OPHTHALMOLOGY EXAM  05/12/2018  . FOOT EXAM  07/03/2018  . INFLUENZA VACCINE  07/12/2018  . COLONOSCOPY  09/06/2018  . TETANUS/TDAP  05/30/2026  . Hepatitis C Screening  Completed  . PNA vac Low Risk Adult  Completed   Cancer Screenings: Lung: Low Dose CT Chest recommended if Age 53-80 years, 30 pack-year currently smoking OR have quit w/in 15years. Patient does not qualify. Colorectal: completed 09/06/2013  Additional Screenings:  Hepatitis C Screening: completed 02/10/2016      Plan:    I have personally reviewed and addressed the Medicare Annual Wellness questionnaire and have noted the following in the patient's chart:  A. Medical and social history B. Use of alcohol, tobacco or illicit drugs  C. Current medications and supplements D. Functional ability and status E.  Nutritional status F.  Physical activity G. Advance directives H. List of other physicians I.  Hospitalizations, surgeries, and ER visits in previous 12 months J.  [place] such as hearing and vision if needed, cognitive and depression L. Referrals and appointments   In addition, I have reviewed and discussed with patient certain preventive protocols, quality metrics, and best practice recommendations. A written personalized care plan for preventive services as well as general preventive health recommendations were provided to patient.   Signed,  Tyler Aas, LPN Nurse Health Advisor   Nurse Notes: due for diabetic foot exam on CPE 07/04/2018.  Called Dr.Woodards office for diabetic eye exam results, LM for report to be faxed.

## 2018-07-04 ENCOUNTER — Other Ambulatory Visit: Payer: Self-pay | Admitting: Family Medicine

## 2018-07-04 ENCOUNTER — Encounter: Payer: Self-pay | Admitting: Family Medicine

## 2018-07-04 ENCOUNTER — Ambulatory Visit (INDEPENDENT_AMBULATORY_CARE_PROVIDER_SITE_OTHER): Payer: PPO | Admitting: Family Medicine

## 2018-07-04 VITALS — BP 136/76 | HR 77 | Temp 97.4°F | Ht 72.0 in | Wt 265.2 lb

## 2018-07-04 DIAGNOSIS — E1122 Type 2 diabetes mellitus with diabetic chronic kidney disease: Secondary | ICD-10-CM

## 2018-07-04 DIAGNOSIS — M109 Gout, unspecified: Secondary | ICD-10-CM | POA: Diagnosis not present

## 2018-07-04 DIAGNOSIS — E785 Hyperlipidemia, unspecified: Secondary | ICD-10-CM | POA: Diagnosis not present

## 2018-07-04 DIAGNOSIS — I10 Essential (primary) hypertension: Secondary | ICD-10-CM

## 2018-07-04 DIAGNOSIS — E1142 Type 2 diabetes mellitus with diabetic polyneuropathy: Secondary | ICD-10-CM

## 2018-07-04 DIAGNOSIS — Z0001 Encounter for general adult medical examination with abnormal findings: Secondary | ICD-10-CM

## 2018-07-04 DIAGNOSIS — N400 Enlarged prostate without lower urinary tract symptoms: Secondary | ICD-10-CM

## 2018-07-04 DIAGNOSIS — Z7189 Other specified counseling: Secondary | ICD-10-CM

## 2018-07-04 DIAGNOSIS — N183 Chronic kidney disease, stage 3 (moderate): Secondary | ICD-10-CM

## 2018-07-04 LAB — BAYER DCA HB A1C WAIVED: HB A1C: 6.8 % (ref <7.0)

## 2018-07-04 MED ORDER — PANTOPRAZOLE SODIUM 40 MG PO TBEC: 40.0000 mg | DELAYED_RELEASE_TABLET | Freq: Every day | ORAL | 4 refills | Status: DC

## 2018-07-04 MED ORDER — ATORVASTATIN CALCIUM 10 MG PO TABS: 10.0000 mg | ORAL_TABLET | Freq: Every day | ORAL | 4 refills | Status: DC

## 2018-07-04 MED ORDER — BENAZEPRIL HCL 40 MG PO TABS: 40.0000 mg | ORAL_TABLET | Freq: Every day | ORAL | 4 refills | Status: DC

## 2018-07-04 MED ORDER — AMLODIPINE BESYLATE 10 MG PO TABS: 10.0000 mg | ORAL_TABLET | Freq: Every day | ORAL | 4 refills | Status: DC

## 2018-07-04 MED ORDER — GABAPENTIN 300 MG PO CAPS: 300.0000 mg | ORAL_CAPSULE | Freq: Two times a day (BID) | ORAL | 4 refills | Status: DC

## 2018-07-04 MED ORDER — DICLOFENAC SODIUM 1 % TD GEL: 2.0000 g | Freq: Four times a day (QID) | TRANSDERMAL | 4 refills | Status: DC

## 2018-07-04 MED ORDER — ALLOPURINOL 100 MG PO TABS: 100.0000 mg | ORAL_TABLET | Freq: Every day | ORAL | 4 refills | Status: DC

## 2018-07-04 MED ORDER — METFORMIN HCL ER 500 MG PO TB24: 500.0000 mg | ORAL_TABLET | Freq: Three times a day (TID) | ORAL | 4 refills | Status: DC

## 2018-07-04 MED ORDER — FLUTICASONE PROPIONATE 50 MCG/ACT NA SUSP: 2.0000 | Freq: Every day | NASAL | 11 refills | Status: DC

## 2018-07-04 NOTE — Assessment & Plan Note (Signed)
A voluntary discussion about advanced care planning including explanation and discussion of advanced directives was extentively discussed with the patient.  Explained about the healthcare proxy and living will was reviewed and packet with forms with expiration of how to fill them out was given.  Time spent: Encounter 16+ min individuals present: Patient  Patient brought in his living will today.

## 2018-07-04 NOTE — Assessment & Plan Note (Signed)
The current medical regimen is effective;  continue present plan and medications.  

## 2018-07-04 NOTE — Progress Notes (Signed)
BP 136/76 (BP Location: Left Arm)   Pulse 77   Temp (!) 97.4 F (36.3 C) (Oral)   Ht 6' (1.829 m)   Wt 265 lb 3.2 oz (120.3 kg)   SpO2 96%   BMI 35.97 kg/m    Subjective:    Patient ID: Jerry Pollard, male    DOB: 1951-12-10, 67 y.o.   MRN: [id number]  HPI: Jerry Pollard is a 67 y.o. male  Chief Complaint  Patient presents with  . Annual Exam    pt had wellness exam 06/29/18  . Diabetes  Reviewed diabetes and notes from endocrinology patient is graduated from endocrinology as he is doing so well.  Doing good with insulin management and good control with titration up and down for his blood sugars. Proof in the pudding today is patient's A1c is excellent at 6.8! Takes gabapentin 1 pill at night for neuropathy which is much improved. Tramadol is used for real bad pain niacin on a regular basis just has not standby for primarily arthritis type concerns after a big day. Takes Protonix without problems and every third day with good control of symptoms. Taking blood pressure with good control of blood pressure. Taking atorvastatin every other day and doing well will pay special attention to liver today. Allopurinol for gout seems to be doing well with no complaints. Continues to use diclofenac gel for PRN use of arthralgia complaints primarily his knees and back.      Relevant past medical, surgical, family and social history reviewed and updated as indicated. Interim medical history since our last visit reviewed. Allergies and medications reviewed and updated.  Review of Systems  Constitutional: Negative.   HENT: Negative.   Eyes: Negative.   Respiratory: Negative.   Cardiovascular: Negative.   Gastrointestinal: Negative.   Endocrine: Negative.   Genitourinary: Negative.   Musculoskeletal: Negative.   Skin: Negative.   Allergic/Immunologic: Negative.   Neurological: Negative.   Hematological: Negative.   Psychiatric/Behavioral: Negative.     Per HPI unless  specifically indicated above     Objective:    BP 136/76 (BP Location: Left Arm)   Pulse 77   Temp (!) 97.4 F (36.3 C) (Oral)   Ht 6' (1.829 m)   Wt 265 lb 3.2 oz (120.3 kg)   SpO2 96%   BMI 35.97 kg/m   Wt Readings from Last 3 Encounters:  07/04/18 265 lb 3.2 oz (120.3 kg)  06/29/18 265 lb 11.2 oz (120.5 kg)  04/11/18 269 lb (122 kg)    Physical Exam  Constitutional: He is oriented to person, place, and time. He appears well-developed and well-nourished.  HENT:  Head: Normocephalic and atraumatic.  Right Ear: External ear normal.  Left Ear: External ear normal.  Eyes: Pupils are equal, round, and reactive to light. Conjunctivae and EOM are normal.  Neck: Normal range of motion. Neck supple.  Cardiovascular: Normal rate, regular rhythm, normal heart sounds and intact distal pulses.  Pulmonary/Chest: Effort normal and breath sounds normal.  Abdominal: Soft. Bowel sounds are normal. There is no splenomegaly or hepatomegaly.  Genitourinary: Rectum normal, prostate normal and penis normal.  Musculoskeletal: Normal range of motion.  Neurological: He is alert and oriented to person, place, and time. He has normal reflexes.  Skin: No rash noted. No erythema.  Psychiatric: He has a normal mood and affect. His behavior is normal. Judgment and thought content normal.    Results for orders placed or performed in visit on 04/11/18  [facility],  Waived  Result Value Ref Range   ALT (SGPT) Piccolo, Waived 70 (H) 10 - 47 U/L   AST (SGOT) Piccolo, Waived 50 (H) 11 - 38 U/L   Cholesterol Piccolo, Waived 155 <200 mg/dL   HDL Chol Piccolo, Waived 48 (L) >59 mg/dL   Triglycerides Piccolo,Waived 290 (H) <150 mg/dL   Chol/HDL Ratio Piccolo,Waive 3.2 mg/dL   LDL Chol Calc Piccolo Waived 49 <100 mg/dL   VLDL Chol Calc Piccolo,Waive 58 (H) <30 mg/dL      Assessment & Plan:   Problem List Items Addressed This Visit      Cardiovascular and Mediastinum   Essential hypertension     The current medical regimen is effective;  continue present plan and medications.       Relevant Medications   benazepril (LOTENSIN) 40 MG tablet   atorvastatin (LIPITOR) 10 MG tablet   amLODipine (NORVASC) 10 MG tablet   Other Relevant Orders   Comprehensive metabolic panel   TSH     Endocrine   Type 2 diabetes mellitus with peripheral neuropathy (HCC)    The current medical regimen is effective;  continue present plan and medications.       Relevant Medications   metFORMIN (GLUCOPHAGE-XR) 500 MG 24 hr tablet   gabapentin (NEURONTIN) 300 MG capsule   benazepril (LOTENSIN) 40 MG tablet   atorvastatin (LIPITOR) 10 MG tablet   Other Relevant Orders   Bayer DCA Hb A1c Waived   Comprehensive metabolic panel   TSH   CKD stage 3 due to type 2 diabetes mellitus (HCC)   Relevant Medications   metFORMIN (GLUCOPHAGE-XR) 500 MG 24 hr tablet   benazepril (LOTENSIN) 40 MG tablet   atorvastatin (LIPITOR) 10 MG tablet   Other Relevant Orders   Comprehensive metabolic panel   CBC with Differential/Platelet   TSH     Genitourinary   BPH (benign prostatic hyperplasia)   Relevant Orders   Comprehensive metabolic panel   TSH   PSA     Other   Hyperlipidemia    The current medical regimen is effective;  continue present plan and medications.       Relevant Medications   benazepril (LOTENSIN) 40 MG tablet   atorvastatin (LIPITOR) 10 MG tablet   amLODipine (NORVASC) 10 MG tablet   Other Relevant Orders   Comprehensive metabolic panel   Lipid panel   TSH   Gout - Primary    The current medical regimen is effective;  continue present plan and medications.       Relevant Orders   Comprehensive metabolic panel   TSH   Uric acid   Advanced care planning/counseling discussion    A voluntary discussion about advanced care planning including explanation and discussion of advanced directives was extentively discussed with the patient.  Explained about the healthcare proxy and  living will was reviewed and packet with forms with expiration of how to fill them out was given.  Time spent: Encounter 16+ min individuals present: Patient  Patient brought in his living will today.          Follow up plan: Return in about 3 months (around 10/04/2018) for Hemoglobin A1c.

## 2018-07-05 ENCOUNTER — Encounter: Payer: Self-pay | Admitting: Family Medicine

## 2018-07-05 LAB — CBC WITH DIFFERENTIAL/PLATELET
BASOS ABS: 0 10*3/uL (ref 0.0–0.2)
Basos: 1 %
EOS (ABSOLUTE): 0.2 10*3/uL (ref 0.0–0.4)
Eos: 3 %
Hematocrit: 40.2 % (ref 37.5–51.0)
Hemoglobin: 13.3 g/dL (ref 13.0–17.7)
Immature Grans (Abs): 0 10*3/uL (ref 0.0–0.1)
Immature Granulocytes: 0 %
LYMPHS ABS: 1.6 10*3/uL (ref 0.7–3.1)
Lymphs: 26 %
MCH: 30.1 pg (ref 26.6–33.0)
MCHC: 33.1 g/dL (ref 31.5–35.7)
MCV: 91 fL (ref 79–97)
MONOCYTES: 5 %
MONOS ABS: 0.3 10*3/uL (ref 0.1–0.9)
Neutrophils Absolute: 4.1 10*3/uL (ref 1.4–7.0)
Neutrophils: 65 %
PLATELETS: 207 10*3/uL (ref 150–450)
RBC: 4.42 x10E6/uL (ref 4.14–5.80)
RDW: 15.2 % (ref 12.3–15.4)
WBC: 6.3 10*3/uL (ref 3.4–10.8)

## 2018-07-05 LAB — COMPREHENSIVE METABOLIC PANEL
ALBUMIN: 4.7 g/dL (ref 3.6–4.8)
ALK PHOS: 114 IU/L (ref 39–117)
ALT: 27 IU/L (ref 0–44)
AST: 20 IU/L (ref 0–40)
Albumin/Globulin Ratio: 2.8 — ABNORMAL HIGH (ref 1.2–2.2)
BUN/Creatinine Ratio: 19 (ref 10–24)
BUN: 23 mg/dL (ref 8–27)
Bilirubin Total: 0.5 mg/dL (ref 0.0–1.2)
CALCIUM: 9.2 mg/dL (ref 8.6–10.2)
CO2: 23 mmol/L (ref 20–29)
CREATININE: 1.18 mg/dL (ref 0.76–1.27)
Chloride: 102 mmol/L (ref 96–106)
GFR, EST AFRICAN AMERICAN: 73 mL/min/{1.73_m2} (ref >59)
GFR, EST NON AFRICAN AMERICAN: 63 mL/min/{1.73_m2} (ref >59)
GLUCOSE: 85 mg/dL (ref 65–99)
Globulin, Total: 1.7 g/dL (ref 1.5–4.5)
POTASSIUM: 4 mmol/L (ref 3.5–5.2)
Sodium: 140 mmol/L (ref 134–144)
Total Protein: 6.4 g/dL (ref 6.0–8.5)

## 2018-07-05 LAB — LIPID PANEL
CHOL/HDL RATIO: 3.9 ratio (ref 0.0–5.0)
Cholesterol, Total: 162 mg/dL (ref 100–199)
HDL: 42 mg/dL (ref >39)
LDL CALC: 75 mg/dL (ref 0–99)
Triglycerides: 224 mg/dL — ABNORMAL HIGH (ref 0–149)
VLDL Cholesterol Cal: 45 mg/dL — ABNORMAL HIGH (ref 5–40)

## 2018-07-05 LAB — PSA: Prostate Specific Ag, Serum: 3 ng/mL (ref 0.0–4.0)

## 2018-07-05 LAB — URIC ACID: Uric Acid: 7.8 mg/dL (ref 3.7–8.6)

## 2018-07-05 LAB — TSH: TSH: 3.44 u[IU]/mL (ref 0.450–4.500)

## 2018-07-21 NOTE — Telephone Encounter (Signed)
sched for 07/08/19

## 2018-08-01 DIAGNOSIS — K219 Gastro-esophageal reflux disease without esophagitis: Secondary | ICD-10-CM | POA: Diagnosis not present

## 2018-08-01 DIAGNOSIS — Z86010 Personal history of colonic polyps: Secondary | ICD-10-CM | POA: Diagnosis not present

## 2018-08-07 ENCOUNTER — Telehealth: Payer: Self-pay | Admitting: Family Medicine

## 2018-08-07 MED ORDER — AZITHROMYCIN 250 MG PO TABS: ORAL_TABLET | ORAL | 0 refills | Status: DC

## 2018-08-07 NOTE — Telephone Encounter (Signed)
Copied from [place]. Topic: Quick Communication - See Telephone Encounter >> Aug 07, 2018 11:20 AM Sheran Luz wrote: Pt called inquiring if Dr Jeananne Rama could prescribe him something for sinus pressure and congestion without being seen. Please advise.   [id number] - [place], [place] - [address] [phone] (Phone) [phone] (Fax)

## 2018-08-07 NOTE — Telephone Encounter (Signed)
Prescription sent into [facility]

## 2018-08-27 ENCOUNTER — Other Ambulatory Visit: Payer: Self-pay | Admitting: Family Medicine

## 2018-10-08 ENCOUNTER — Ambulatory Visit: Payer: PPO | Admitting: Anesthesiology

## 2018-10-08 ENCOUNTER — Ambulatory Visit
Admission: RE | Admit: 2018-10-08 | Discharge: 2018-10-08 | Disposition: A | Discharge status: Home / Self Care | Payer: PPO | Source: Ambulatory Visit | Attending: Unknown Physician Specialty | Admitting: Unknown Physician Specialty

## 2018-10-08 ENCOUNTER — Encounter
Admission: RE | Disposition: A | Discharge status: Home / Self Care | Payer: Self-pay | Source: Ambulatory Visit | Attending: Unknown Physician Specialty

## 2018-10-08 ENCOUNTER — Ambulatory Visit: Payer: PPO | Admitting: Family Medicine

## 2018-10-08 ENCOUNTER — Encounter: Payer: Self-pay | Admitting: *Deleted

## 2018-10-08 DIAGNOSIS — Z1211 Encounter for screening for malignant neoplasm of colon: Secondary | ICD-10-CM | POA: Diagnosis not present

## 2018-10-08 DIAGNOSIS — D128 Benign neoplasm of rectum: Secondary | ICD-10-CM | POA: Diagnosis not present

## 2018-10-08 DIAGNOSIS — K219 Gastro-esophageal reflux disease without esophagitis: Secondary | ICD-10-CM | POA: Insufficient documentation

## 2018-10-08 DIAGNOSIS — K648 Other hemorrhoids: Secondary | ICD-10-CM | POA: Diagnosis not present

## 2018-10-08 DIAGNOSIS — F329 Major depressive disorder, single episode, unspecified: Secondary | ICD-10-CM | POA: Diagnosis not present

## 2018-10-08 DIAGNOSIS — K5730 Diverticulosis of large intestine without perforation or abscess without bleeding: Secondary | ICD-10-CM | POA: Insufficient documentation

## 2018-10-08 DIAGNOSIS — Z8249 Family history of ischemic heart disease and other diseases of the circulatory system: Secondary | ICD-10-CM | POA: Insufficient documentation

## 2018-10-08 DIAGNOSIS — Z86010 Personal history of colonic polyps: Secondary | ICD-10-CM | POA: Insufficient documentation

## 2018-10-08 DIAGNOSIS — D122 Benign neoplasm of ascending colon: Secondary | ICD-10-CM | POA: Diagnosis not present

## 2018-10-08 DIAGNOSIS — Z87891 Personal history of nicotine dependence: Secondary | ICD-10-CM | POA: Insufficient documentation

## 2018-10-08 DIAGNOSIS — M109 Gout, unspecified: Secondary | ICD-10-CM | POA: Insufficient documentation

## 2018-10-08 DIAGNOSIS — Z888 Allergy status to other drugs, medicaments and biological substances status: Secondary | ICD-10-CM | POA: Diagnosis not present

## 2018-10-08 DIAGNOSIS — K640 First degree hemorrhoids: Secondary | ICD-10-CM | POA: Diagnosis not present

## 2018-10-08 DIAGNOSIS — G4730 Sleep apnea, unspecified: Secondary | ICD-10-CM | POA: Diagnosis not present

## 2018-10-08 DIAGNOSIS — Z79899 Other long term (current) drug therapy: Secondary | ICD-10-CM | POA: Diagnosis not present

## 2018-10-08 DIAGNOSIS — E785 Hyperlipidemia, unspecified: Secondary | ICD-10-CM | POA: Insufficient documentation

## 2018-10-08 DIAGNOSIS — N189 Chronic kidney disease, unspecified: Secondary | ICD-10-CM | POA: Insufficient documentation

## 2018-10-08 DIAGNOSIS — Z794 Long term (current) use of insulin: Secondary | ICD-10-CM | POA: Diagnosis not present

## 2018-10-08 DIAGNOSIS — K649 Unspecified hemorrhoids: Secondary | ICD-10-CM | POA: Diagnosis not present

## 2018-10-08 DIAGNOSIS — K635 Polyp of colon: Secondary | ICD-10-CM | POA: Diagnosis not present

## 2018-10-08 DIAGNOSIS — M1990 Unspecified osteoarthritis, unspecified site: Secondary | ICD-10-CM | POA: Diagnosis not present

## 2018-10-08 DIAGNOSIS — I129 Hypertensive chronic kidney disease with stage 1 through stage 4 chronic kidney disease, or unspecified chronic kidney disease: Secondary | ICD-10-CM | POA: Diagnosis not present

## 2018-10-08 DIAGNOSIS — K621 Rectal polyp: Secondary | ICD-10-CM | POA: Diagnosis not present

## 2018-10-08 DIAGNOSIS — N182 Chronic kidney disease, stage 2 (mild): Secondary | ICD-10-CM | POA: Diagnosis not present

## 2018-10-08 DIAGNOSIS — D123 Benign neoplasm of transverse colon: Secondary | ICD-10-CM | POA: Insufficient documentation

## 2018-10-08 DIAGNOSIS — E1122 Type 2 diabetes mellitus with diabetic chronic kidney disease: Secondary | ICD-10-CM | POA: Diagnosis not present

## 2018-10-08 DIAGNOSIS — D125 Benign neoplasm of sigmoid colon: Secondary | ICD-10-CM | POA: Diagnosis not present

## 2018-10-08 DIAGNOSIS — K5790 Diverticulosis of intestine, part unspecified, without perforation or abscess without bleeding: Secondary | ICD-10-CM | POA: Diagnosis not present

## 2018-10-08 HISTORY — PX: COLONOSCOPY WITH PROPOFOL: SHX5780

## 2018-10-08 LAB — GLUCOSE, CAPILLARY: GLUCOSE-CAPILLARY: 163 mg/dL — AB (ref 70–99)

## 2018-10-08 SURGERY — COLONOSCOPY WITH PROPOFOL
Anesthesia: General

## 2018-10-08 MED ORDER — LIDOCAINE HCL (PF) 2 % IJ SOLN
INTRAMUSCULAR | Status: AC
  Filled 2018-10-08: qty 10

## 2018-10-08 MED ORDER — PROPOFOL 10 MG/ML IV BOLUS
INTRAVENOUS | Status: DC | PRN
  Administered 2018-10-08 (×2): 20 mg via INTRAVENOUS

## 2018-10-08 MED ORDER — MIDAZOLAM HCL 2 MG/2ML IJ SOLN
INTRAMUSCULAR | Status: AC
  Filled 2018-10-08: qty 2

## 2018-10-08 MED ORDER — SODIUM CHLORIDE 0.9 % IV SOLN
INTRAVENOUS | Status: DC
  Administered 2018-10-08: 1000 mL via INTRAVENOUS

## 2018-10-08 MED ORDER — LIDOCAINE HCL (PF) 2 % IJ SOLN
INTRAMUSCULAR | Status: DC | PRN
  Administered 2018-10-08: 100 mg

## 2018-10-08 MED ORDER — EPHEDRINE SULFATE 50 MG/ML IJ SOLN
INTRAMUSCULAR | Status: DC | PRN
  Administered 2018-10-08: 10 mg via INTRAVENOUS
  Administered 2018-10-08: 15 mg via INTRAVENOUS

## 2018-10-08 MED ORDER — FENTANYL CITRATE (PF) 100 MCG/2ML IJ SOLN
INTRAMUSCULAR | Status: DC | PRN
  Administered 2018-10-08 (×2): 50 ug via INTRAVENOUS

## 2018-10-08 MED ORDER — PROPOFOL 500 MG/50ML IV EMUL
INTRAVENOUS | Status: DC | PRN
  Administered 2018-10-08: 50 ug/kg/min via INTRAVENOUS

## 2018-10-08 MED ORDER — FENTANYL CITRATE (PF) 100 MCG/2ML IJ SOLN
INTRAMUSCULAR | Status: AC
  Filled 2018-10-08: qty 2

## 2018-10-08 MED ORDER — PROPOFOL 500 MG/50ML IV EMUL
INTRAVENOUS | Status: AC
  Filled 2018-10-08: qty 50

## 2018-10-08 MED ORDER — MIDAZOLAM HCL 5 MG/5ML IJ SOLN
INTRAMUSCULAR | Status: DC | PRN
  Administered 2018-10-08: 2 mg via INTRAVENOUS

## 2018-10-08 MED ORDER — SODIUM CHLORIDE 0.9 % IV SOLN: INTRAVENOUS | Status: DC

## 2018-10-08 NOTE — Anesthesia Postprocedure Evaluation (Signed)
Anesthesia Post Note  Patient: ARLENE GENOVA  Procedure(s) Performed: COLONOSCOPY WITH PROPOFOL (N/A )  Patient location during evaluation: Endoscopy Anesthesia Type: General Level of consciousness: awake and alert Pain management: pain level controlled Vital Signs Assessment: post-procedure vital signs reviewed and stable Respiratory status: spontaneous breathing, nonlabored ventilation, respiratory function stable and patient connected to nasal cannula oxygen Cardiovascular status: blood pressure returned to baseline and stable Postop Assessment: no apparent nausea or vomiting Anesthetic complications: no     Last Vitals:  Vitals:   10/08/18 0805 10/08/18 0835  BP: 110/63 121/62  Pulse: 64   Resp: 16   Temp: (!) 36.1 C   SpO2: 95%     Last Pain:  Vitals:   10/08/18 0833  TempSrc:   PainSc: 0-No pain                 Precious Haws Piscitello

## 2018-10-08 NOTE — Anesthesia Post-op Follow-up Note (Signed)
Anesthesia QCDR form completed.        

## 2018-10-08 NOTE — Anesthesia Preprocedure Evaluation (Signed)
Anesthesia Evaluation  Patient identified by MRN, date of birth, ID band Patient awake    Reviewed: Allergy & Precautions, H&P , NPO status , Patient's Chart, lab work & pertinent test results  History of Anesthesia Complications Negative for: history of anesthetic complications  Airway Mallampati: III  TM Distance: <3 FB Neck ROM: limited    Dental  (+) Chipped   Pulmonary neg shortness of breath, former smoker,           Cardiovascular Exercise Tolerance: Good hypertension, (-) angina(-) Past MI and (-) DOE      Neuro/Psych PSYCHIATRIC DISORDERS  Neuromuscular disease    GI/Hepatic Neg liver ROS, GERD  Medicated and Controlled,  Endo/Other  diabetes, Type 2  Renal/GU Renal disease  negative genitourinary   Musculoskeletal   Abdominal   Peds  Hematology negative hematology ROS (+)   Anesthesia Other Findings Past Medical History: No date: Arthritis No date: Chronic kidney disease No date: Depression No date: Diabetes mellitus without complication (HCC) No date: GERD (gastroesophageal reflux disease) No date: Gout No date: Hyperlipidemia No date: Hypertension  Past Surgical History: No date: APPENDECTOMY No date: BACK SURGERY No date: CARPAL TUNNEL RELEASE; Right No date: CHOLECYSTECTOMY No date: COLONOSCOPY No date: SPINE SURGERY  BMI    Body Mass Index:  33.91 kg/m      Reproductive/Obstetrics negative OB ROS                             Anesthesia Physical Anesthesia Plan  ASA: III  Anesthesia Plan: General   Post-op Pain Management:    Induction: Intravenous  PONV Risk Score and Plan: Propofol infusion and TIVA  Airway Management Planned: Natural Airway and Nasal Cannula  Additional Equipment:   Intra-op Plan:   Post-operative Plan:   Informed Consent: I have reviewed the patients History and Physical, chart, labs and discussed the procedure including  the risks, benefits and alternatives for the proposed anesthesia with the patient or authorized representative who has indicated his/her understanding and acceptance.   Dental Advisory Given  Plan Discussed with: Anesthesiologist, CRNA and Surgeon  Anesthesia Plan Comments: (Patient consented for risks of anesthesia including but not limited to:  - adverse reactions to medications - risk of intubation if required - damage to teeth, lips or other oral mucosa - sore throat or hoarseness - Damage to heart, brain, lungs or loss of life  Patient voiced understanding.)        Anesthesia Quick Evaluation

## 2018-10-08 NOTE — Op Note (Signed)
[facility] Patient Name: Jerry Pollard Procedure Date: 10/08/2018 7:30 AM MRN: [id number] Account #: [id number] Date of Birth: [birth year] Admit Type: Outpatient Age: 67 Room: [facility] ENDO ROOM 2 Gender: Male Note Status: Finalized Procedure:            Colonoscopy Indications:          High risk colon cancer surveillance: Personal history                        of colonic polyps Providers:            [name], MD Referring MD:         Guadalupe Maple, MD (Referring MD) Medicines:            Propofol per Anesthesia Complications:        No immediate complications. Procedure:            Pre-Anesthesia Assessment:                       - After reviewing the risks and benefits, the patient                        was deemed in satisfactory condition to undergo the                        procedure.                       After obtaining informed consent, the colonoscope was                        passed under direct vision. Throughout the procedure,                        the patient's blood pressure, pulse, and oxygen                        saturations were monitored continuously. The                        Colonoscope was introduced through the anus and                        advanced to the the cecum, identified by appendiceal                        orifice and ileocecal valve. The colonoscopy was                        performed without difficulty. The patient tolerated the                        procedure well. The quality of the bowel preparation                        was excellent. Findings:      Three sessile polyps were found in the ascending colon. The polyps were       small in size. These polyps were removed with a hot snare. Resection and       retrieval were complete.      A diminutive polyp  was found in the ascending colon. The polyp was       sessile. The polyp was removed with a jumbo cold forceps. Resection and       retrieval  were complete.      Two sessile polyps were found in the transverse colon. The polyps were       diminutive in size. These polyps were removed with a jumbo cold forceps.       Resection and retrieval were complete.      A diminutive polyp was found in the transverse colon. The polyp was       sessile. The polyp was removed with a jumbo cold forceps. Resection and       retrieval were complete.      A diminutive polyp was found in the transverse colon. The polyp was       sessile. The polyp was removed with a hot snare. Resection and retrieval       were complete.      A small polyp was found in the splenic flexure. The polyp was sessile.       The polyp was removed with a hot snare. Resection and retrieval were       complete.      A diminutive polyp was found in the sigmoid colon. The polyp was       sessile. The polyp was removed with a hot snare. Resection and retrieval       were complete.      A diminutive polyp was found in the rectum. The polyp was sessile. The       polyp was removed with a jumbo cold forceps. Resection and retrieval       were complete.      A few small-mouthed diverticula were found in the sigmoid colon.      Internal hemorrhoids were found during endoscopy. The hemorrhoids were       small and Grade I (internal hemorrhoids that do not prolapse).      The exam was otherwise without abnormality. Impression:           - Three small polyps in the ascending colon, removed                        with a hot snare. Resected and retrieved.                       - One diminutive polyp in the ascending colon, removed                        with a jumbo cold forceps. Resected and retrieved.                       - Two diminutive polyps in the transverse colon,                        removed with a jumbo cold forceps. Resected and                        retrieved.                       - One diminutive polyp in the transverse colon, removed  with a  jumbo cold forceps. Resected and retrieved.                       - One diminutive polyp in the transverse colon, removed                        with a hot snare. Resected and retrieved.                       - One small polyp at the splenic flexure, removed with                        a hot snare. Resected and retrieved.                       - One diminutive polyp in the sigmoid colon, removed                        with a hot snare. Resected and retrieved.                       - One diminutive polyp in the rectum, removed with a                        jumbo cold forceps. Resected and retrieved.                       - Diverticulosis in the sigmoid colon.                       - Internal hemorrhoids.                       - The examination was otherwise normal. Recommendation:       - Await pathology results. [name], MD 10/08/2018 8:08:19 AM This report has been signed electronically. Number of Addenda: 0 Note Initiated On: 10/08/2018 7:30 AM Scope Withdrawal Time: 0 hours 21 minutes 30 seconds  Total Procedure Duration: 0 hours 28 minutes 6 seconds       [facility]

## 2018-10-08 NOTE — H&P (Signed)
Primary Care Physician:  Guadalupe Maple, MD Primary Gastroenterologist:  Dr. Vira Agar  Pre-Procedure History & Physical: HPI:  Jerry Pollard is a 67 y.o. male is here for an colonoscopy.  For previous colon polyp removal.   Past Medical History:  Diagnosis Date  . Arthritis   . Chronic kidney disease   . Depression   . Diabetes mellitus without complication ([place])   . GERD (gastroesophageal reflux disease)   . Gout   . Hyperlipidemia   . Hypertension     Past Surgical History:  Procedure Laterality Date  . APPENDECTOMY    . BACK SURGERY    . CARPAL TUNNEL RELEASE Right   . CHOLECYSTECTOMY    . COLONOSCOPY    . SPINE SURGERY      Prior to Admission medications   Medication Sig Start Date End Date Taking? Authorizing Provider  allopurinol (ZYLOPRIM) 100 MG tablet Take 1 tablet (100 mg total) by mouth daily. 07/04/18  Yes Crissman, Jeannette How, MD  amLODipine (NORVASC) 10 MG tablet Take 1 tablet (10 mg total) by mouth daily. 07/04/18  Yes Crissman, Jeannette How, MD  benazepril (LOTENSIN) 40 MG tablet Take 1 tablet (40 mg total) by mouth daily. 07/04/18  Yes Crissman, Jeannette How, MD  chlorpheniramine-HYDROcodone (TUSSIONEX PENNKINETIC ER) 10-8 MG/5ML SUER Take 5 mLs by mouth every 12 (twelve) hours as needed for cough.   Yes [provider]  diclofenac (VOLTAREN) 50 MG EC tablet Take 50 mg by mouth 2 (two) times daily.   Yes [provider]  fexofenadine (ALLEGRA) 180 MG tablet Take 180 mg by mouth daily.   Yes [provider]  gabapentin (NEURONTIN) 300 MG capsule Take 1 capsule (300 mg total) by mouth 2 (two) times daily. 07/04/18  Yes Crissman, Jeannette How, MD  insulin aspart (NOVOLOG FLEXPEN) 100 UNIT/ML FlexPen INJECT 50-60 UNITS UNDER THE SKIN 3 TIMES A DAY 05/30/16  Yes Crissman, Jeannette How, MD  LEVEMIR FLEXTOUCH 100 UNIT/ML Pen INJECT 50 UNITS INTO THE SKIN DAILY AT 10 PM. Patient taking differently: Inject 50 Units into the skin daily at 10 pm. Uses [facility] 06/11/17  Yes  Crissman, Jeannette How, MD  metFORMIN (GLUCOPHAGE-XR) 500 MG 24 hr tablet Take 1 tablet (500 mg total) by mouth 3 (three) times daily. 07/04/18  Yes Guadalupe Maple, MD  ONE TOUCH ULTRA TEST test strip USE AS DIRECTED 07/06/15  Yes Crissman, Jeannette How, MD  pantoprazole (PROTONIX) 40 MG tablet Take 1 tablet (40 mg total) by mouth daily. 07/04/18  Yes Guadalupe Maple, MD  amoxicillin-clavulanate (AUGMENTIN) 875-125 MG tablet Take 1 tablet by mouth 2 (two) times daily.    [provider]  atorvastatin (LIPITOR) 10 MG tablet Take 1 tablet (10 mg total) by mouth daily. Patient not taking: Reported on 10/08/2018 07/04/18   Guadalupe Maple, MD  azithromycin (ZITHROMAX) 250 MG tablet 2 now then 1 a day 08/07/18   Guadalupe Maple, MD  colchicine 0.6 MG tablet Take 1 tablet (0.6 mg total) by mouth daily. 05/30/16   Guadalupe Maple, MD  diclofenac sodium (VOLTAREN) 1 % GEL Apply 2 g topically 4 (four) times daily. 07/04/18   Guadalupe Maple, MD  diclofenac sodium (VOLTAREN) 1 % GEL APPLY 4 GMS OF GEL TO AFFECTED SKIN FOUR TIMES DAILY 08/27/18   Guadalupe Maple, MD  febuxostat (ULORIC) 40 MG tablet Take 80 mg by mouth daily.    [provider]  fluticasone (FLONASE) 50 MCG/ACT nasal spray  Place 2 sprays into both nostrils daily. 07/04/18   Guadalupe Maple, MD  prednisoLONE 5 MG TABS tablet Take 5 mg by mouth.    [provider]  traMADol (ULTRAM) 50 MG tablet Take 1 tablet (50 mg total) by mouth daily as needed. Patient not taking: Reported on 06/29/2018 07/03/17   Guadalupe Maple, MD    Allergies as of 09/04/2018 - Review Complete 07/04/2018  Allergen Reaction Noted  . Crestor [rosuvastatin] Other (See Comments) 07/13/2015    Family History  Problem Relation Age of Onset  . Hypertension Mother   . Cancer Mother   . Cancer Father   . Heart attack Brother     Social History   Socioeconomic History  . Marital status: Single    Spouse name: Not on file  . Number of children: Not  on file  . Years of education: Not on file  . Highest education level: High school graduate  Occupational History  . Not on file  Social Needs  . Financial resource strain: Not hard at all  . Food insecurity:    Worry: Never true    Inability: Never true  . Transportation needs:    Medical: No    Non-medical: No  Tobacco Use  . Smoking status: Former Smoker    Types: Cigarettes    Last attempt to quit: 07/12/1997    Years since quitting: 21.2  . Smokeless tobacco: Never Used  Substance and Sexual Activity  . Alcohol use: No  . Drug use: No  . Sexual activity: Not on file  Lifestyle  . Physical activity:    Days per week: 0 days    Minutes per session: 0 min  . Stress: Not at all  Relationships  . Social connections:    Talks on phone: More than three times a week    Gets together: More than three times a week    Attends religious service: More than 4 times per year    Active member of club or organization: Yes    Attends meetings of clubs or organizations: More than 4 times per year    Relationship status: Not on file  . Intimate partner violence:    Fear of current or ex partner: No    Emotionally abused: No    Physically abused: No    Forced sexual activity: No  Other Topics Concern  . Not on file  Social History Narrative   Working part time     Review of Systems: See HPI, otherwise negative ROS  Physical Exam: BP 136/80   Pulse 96   Temp (!) 97 F (36.1 C) (Tympanic)   Resp 18   Ht 6\' 1"  (1.854 m)   Wt 116.6 kg   SpO2 97%   BMI 33.91 kg/m  General:   Alert,  pleasant and cooperative in NAD Head:  Normocephalic and atraumatic. Neck:  Supple; no masses or thyromegaly. Lungs:  Clear throughout to auscultation.    Heart:  Regular rate and rhythm. Abdomen:  Soft, nontender and nondistended. Normal bowel sounds, without guarding, and without rebound.   Neurologic:  Alert and  oriented x4;  grossly normal neurologically.  Impression/Plan: Jerry Pollard  is here for an colonoscopy to be performed for [facility] colon polyps. Last one 09/06/13  Risks, benefits, limitations, and alternatives regarding  colonoscopy have been reviewed with the patient.  Questions have been answered.  All parties agreeable.   Gaylyn Cheers, MD  10/08/2018, 7:27 AM

## 2018-10-08 NOTE — Transfer of Care (Signed)
Immediate Anesthesia Transfer of Care Note  Patient: PHILMORE LEPORE  Procedure(s) Performed: COLONOSCOPY WITH PROPOFOL (N/A )  Patient Location: PACU  Anesthesia Type:General  Level of Consciousness: sedated  Airway & Oxygen Therapy: Patient Spontanous Breathing and Patient connected to nasal cannula oxygen  Post-op Assessment: Report given to RN and Post -op Vital signs reviewed and stable  Post vital signs: Reviewed and stable  Last Vitals:  Vitals Value Taken Time  BP 110/63 10/08/2018  8:06 AM  Temp 36.1 C 10/08/2018  8:05 AM  Pulse 66 10/08/2018  8:06 AM  Resp 15 10/08/2018  8:06 AM  SpO2 95 % 10/08/2018  8:06 AM  Vitals shown include unvalidated device data.  Last Pain:  Vitals:   10/08/18 0805  TempSrc: Tympanic  PainSc: 0-No pain         Complications: No apparent anesthesia complications

## 2018-10-09 ENCOUNTER — Encounter: Payer: Self-pay | Admitting: Unknown Physician Specialty

## 2018-10-09 LAB — SURGICAL PATHOLOGY

## 2018-10-18 ENCOUNTER — Encounter: Payer: Self-pay | Admitting: Family Medicine

## 2018-10-18 ENCOUNTER — Ambulatory Visit (INDEPENDENT_AMBULATORY_CARE_PROVIDER_SITE_OTHER): Payer: PPO | Admitting: Family Medicine

## 2018-10-18 VITALS — BP 132/78 | HR 79 | Temp 97.0°F | Ht 72.0 in | Wt 258.4 lb

## 2018-10-18 DIAGNOSIS — I10 Essential (primary) hypertension: Secondary | ICD-10-CM

## 2018-10-18 DIAGNOSIS — E785 Hyperlipidemia, unspecified: Secondary | ICD-10-CM

## 2018-10-18 DIAGNOSIS — E1142 Type 2 diabetes mellitus with diabetic polyneuropathy: Secondary | ICD-10-CM

## 2018-10-18 DIAGNOSIS — E1122 Type 2 diabetes mellitus with diabetic chronic kidney disease: Secondary | ICD-10-CM

## 2018-10-18 DIAGNOSIS — N183 Chronic kidney disease, stage 3 (moderate): Secondary | ICD-10-CM | POA: Diagnosis not present

## 2018-10-18 MED ORDER — COLCHICINE 0.6 MG PO TABS: 0.6000 mg | ORAL_TABLET | Freq: Every day | ORAL | 3 refills | Status: DC

## 2018-10-18 NOTE — Assessment & Plan Note (Signed)
The current medical regimen is effective;  continue present plan and medications.  

## 2018-10-18 NOTE — Progress Notes (Signed)
   BP 132/78   Pulse 79   Temp (!) 97 F (36.1 C) (Oral)   Ht 6' (1.829 m)   Wt 258 lb 6.4 oz (117.2 kg)   SpO2 96%   BMI 35.05 kg/m    Subjective:    Patient ID: Jerry Pollard, male    DOB: 1951-10-18, 67 y.o.   MRN: [id number]  HPI: Jerry Pollard is a 67 y.o. male  Diabetes medicine recheck. Patient's diabetes been doing well no low blood sugar spells or issues. Also hypertension doing well with medications cholesterol doing well.  No gout symptoms taking allopurinol without problems.  Diclofenac gel is working really well for arthritis complaints.  Relevant past medical, surgical, family and social history reviewed and updated as indicated. Interim medical history since our last visit reviewed. Allergies and medications reviewed and updated.  Review of Systems  Constitutional: Negative.   Respiratory: Negative.   Cardiovascular: Negative.     Per HPI unless specifically indicated above     Objective:    BP 132/78   Pulse 79   Temp (!) 97 F (36.1 C) (Oral)   Ht 6' (1.829 m)   Wt 258 lb 6.4 oz (117.2 kg)   SpO2 96%   BMI 35.05 kg/m   Wt Readings from Last 3 Encounters:  10/18/18 258 lb 6.4 oz (117.2 kg)  10/08/18 257 lb (116.6 kg)  07/04/18 265 lb 3.2 oz (120.3 kg)    Physical Exam  Constitutional: He is oriented to person, place, and time. He appears well-developed and well-nourished.  HENT:  Head: Normocephalic and atraumatic.  Eyes: Conjunctivae and EOM are normal.  Neck: Normal range of motion.  Cardiovascular: Normal rate, regular rhythm and normal heart sounds.  Pulmonary/Chest: Effort normal and breath sounds normal.  Musculoskeletal: Normal range of motion.  Neurological: He is alert and oriented to person, place, and time.  Skin: No erythema.  Psychiatric: He has a normal mood and affect. His behavior is normal. Judgment and thought content normal.        Assessment & Plan:   Problem List Items Addressed This Visit      Cardiovascular and  Mediastinum   Essential hypertension    The current medical regimen is effective;  continue present plan and medications.         Endocrine   Type 2 diabetes mellitus with peripheral neuropathy (HCC) - Primary    The current medical regimen is effective;  continue present plan and medications.       Relevant Orders   Bayer DCA Hb A1c Waived   CKD stage 3 due to type 2 diabetes mellitus ([place])    The current medical regimen is effective;  continue present plan and medications.         Other   Hyperlipidemia    The current medical regimen is effective;  continue present plan and medications.           Follow up plan: Return in about 3 months (around 01/18/2019) for Hemoglobin A1c, BMP,  Lipids, ALT, AST.

## 2018-10-18 NOTE — Addendum Note (Signed)
Addended by: Golden Pop A on: 10/18/2018 02:19 PM   Modules accepted: Orders

## 2018-10-19 LAB — BAYER DCA HB A1C WAIVED: HB A1C: 6.4 % (ref <7.0)

## 2018-10-22 ENCOUNTER — Telehealth: Payer: Self-pay | Admitting: Family Medicine

## 2018-10-22 NOTE — Telephone Encounter (Signed)
Copied from [place] [id number]. Topic: Quick Communication - Rx Refill/Question >> Oct 22, 2018 10:10 AM Rayann Heman wrote: Medication: ONE TOUCH ULTRA TEST test strip [[id number]] and lancets   Has the patient contacted their pharmacy? No Preferred Pharmacy (with phone number or street name): [id number] - [place], [place] [phone] (Phone) [phone] (Fax)  Agent: Please be advised that RX refills may take up to 3 business days. We ask that you follow-up with your pharmacy.

## 2018-10-29 MED ORDER — GLUCOSE BLOOD VI STRP: 100.0000 | ORAL_STRIP | 12 refills | Status: DC

## 2018-10-29 MED ORDER — ONETOUCH ULTRASOFT LANCETS MISC: 12 refills | Status: AC

## 2018-10-29 NOTE — Telephone Encounter (Signed)
done

## 2018-10-29 NOTE — Telephone Encounter (Signed)
LVM that his lancets and test strips was sent to his pharmacy.  (DPR Reviewed)

## 2018-10-29 NOTE — Telephone Encounter (Signed)
Pt is calling back to see why Dr. Jeananne Rama has not refilled his medication for his lancet, test strips? Pt would like a call back, thanks.

## 2018-11-19 ENCOUNTER — Other Ambulatory Visit: Payer: Self-pay | Admitting: Family Medicine

## 2019-01-21 ENCOUNTER — Ambulatory Visit (INDEPENDENT_AMBULATORY_CARE_PROVIDER_SITE_OTHER): Payer: PPO | Admitting: Family Medicine

## 2019-01-21 ENCOUNTER — Encounter: Payer: Self-pay | Admitting: Family Medicine

## 2019-01-21 VITALS — BP 138/78 | HR 71 | Temp 98.7°F | Ht 72.0 in | Wt 264.0 lb

## 2019-01-21 DIAGNOSIS — I10 Essential (primary) hypertension: Secondary | ICD-10-CM

## 2019-01-21 DIAGNOSIS — E785 Hyperlipidemia, unspecified: Secondary | ICD-10-CM

## 2019-01-21 DIAGNOSIS — E1142 Type 2 diabetes mellitus with diabetic polyneuropathy: Secondary | ICD-10-CM

## 2019-01-21 DIAGNOSIS — M5136 Other intervertebral disc degeneration, lumbar region: Secondary | ICD-10-CM | POA: Diagnosis not present

## 2019-01-21 DIAGNOSIS — M1990 Unspecified osteoarthritis, unspecified site: Secondary | ICD-10-CM | POA: Diagnosis not present

## 2019-01-21 LAB — LP+ALT+AST PICCOLO, WAIVED
ALT (SGPT) PICCOLO, WAIVED: 23 U/L (ref 10–47)
AST (SGOT) PICCOLO, WAIVED: 35 U/L (ref 11–38)
Chol/HDL Ratio Piccolo,Waive: 3.6 mg/dL
Cholesterol Piccolo, Waived: 192 mg/dL (ref <200)
HDL Chol Piccolo, Waived: 53 mg/dL — ABNORMAL LOW (ref >59)
LDL Chol Calc Piccolo Waived: 91 mg/dL (ref <100)
Triglycerides Piccolo,Waived: 238 mg/dL — ABNORMAL HIGH (ref <150)
VLDL CHOL CALC PICCOLO,WAIVE: 48 mg/dL — AB (ref <30)

## 2019-01-21 LAB — BAYER DCA HB A1C WAIVED: HB A1C (BAYER DCA - WAIVED): 7 % — ABNORMAL HIGH (ref <7.0)

## 2019-01-21 MED ORDER — TRAMADOL HCL 50 MG PO TABS: 50.0000 mg | ORAL_TABLET | Freq: Every day | ORAL | 1 refills | Status: DC | PRN

## 2019-01-21 NOTE — Assessment & Plan Note (Signed)
We will give prescription for tramadol for rare as needed use.

## 2019-01-21 NOTE — Assessment & Plan Note (Signed)
The current medical regimen is effective;  continue present plan and medications.  

## 2019-01-21 NOTE — Assessment & Plan Note (Addendum)
The current medical regimen is effective;  continue present plan and medications. Taking medicine every other day which is effective with no myalgias.

## 2019-01-21 NOTE — Progress Notes (Signed)
BP 138/78   Pulse 71   Temp 98.7 F (37.1 C) (Oral)   Ht 6' (1.829 m)   Wt 264 lb (119.7 kg)   SpO2 98%   BMI 35.80 kg/m    Subjective:    Patient ID: Jerry Pollard, male    DOB: 03-09-1951, 68 y.o.   MRN: [id number]  HPI: Jerry Pollard is a 68 y.o. male  Patient follow-up diabetes has been doing well except for had to take prednisone for his foot.  His foot issues have resolved has continued to take [place] Patient also wants a refill on tramadol is been over 2 years since he got any.  His back will flareup from time to time which he uses tramadol and that does help. Cholesterol seems to be doing well without problems. Blood pressure also seems to be doing well without problems. 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  Has some mild neuropathy which gabapentin taken at night seems to help.  Relevant past medical, surgical, family and social history reviewed and updated as indicated. Interim medical history since our last visit reviewed. Allergies and medications reviewed and updated.  Review of Systems  Constitutional: Negative.   Respiratory: Negative.   Cardiovascular: Negative.     Per HPI unless specifically indicated above     Objective:    BP 138/78   Pulse 71   Temp 98.7 F (37.1 C) (Oral)   Ht 6' (1.829 m)   Wt 264 lb (119.7 kg)   SpO2 98%   BMI 35.80 kg/m   Wt Readings from Last 3 Encounters:  01/21/19 264 lb (119.7 kg)  10/18/18 258 lb 6.4 oz (117.2 kg)  10/08/18 257 lb (116.6 kg)    Physical Exam Constitutional:      Appearance: He is well-developed.  HENT:     Head: Normocephalic and atraumatic.  Eyes:     Conjunctiva/sclera: Conjunctivae normal.  Neck:     Musculoskeletal: Normal range of motion.  Cardiovascular:     Rate and Rhythm: Normal rate and regular rhythm.     Heart sounds: Normal heart sounds.  Pulmonary:     Effort: Pulmonary effort is normal.     Breath sounds: Normal breath sounds.    Musculoskeletal: Normal range of motion.  Skin:    Findings: No erythema.  Neurological:     Mental Status: He is alert and oriented to person, place, and time.  Psychiatric:        Behavior: Behavior normal.        Thought Content: Thought content normal.        Judgment: Judgment normal.     Results for orders placed or performed in visit on 10/18/18  Bayer DCA Hb A1c Waived  Result Value Ref Range   HB A1C (BAYER DCA - WAIVED) 6.4 <7.0 %      Assessment & Plan:   Problem List Items Addressed This Visit      Cardiovascular and Mediastinum   Essential hypertension - Primary    The current medical regimen is effective;  continue present plan and medications.       Relevant Orders   Bayer DCA Hb A1c Waived   Basic metabolic panel   LP+ALT+AST [place], [place]     Endocrine   Type 2 diabetes mellitus with peripheral neuropathy ([place])    The current medical regimen is effective;  continue present plan and medications.  Relevant Orders   Bayer DCA Hb A1c Waived   Basic metabolic panel   LP+ALT+AST [place], Waived     Musculoskeletal and Integument   Arthritis   Relevant Medications   traMADol (ULTRAM) 50 MG tablet   DDD (degenerative disc disease), lumbar    We will give prescription for tramadol for rare as needed use.      Relevant Medications   traMADol (ULTRAM) 50 MG tablet     Other   Hyperlipidemia    The current medical regimen is effective;  continue present plan and medications. Taking medicine every other day which is effective with no myalgias.      Relevant Orders   Bayer DCA Hb A1c Waived   Basic metabolic panel   LP+ALT+AST [place], Waived       Follow up plan: Return in about 6 months (around 07/22/2019) for Physical Exam, Hemoglobin A1c.

## 2019-01-22 ENCOUNTER — Encounter: Payer: Self-pay | Admitting: Family Medicine

## 2019-01-22 LAB — BASIC METABOLIC PANEL
BUN / CREAT RATIO: 16 (ref 10–24)
BUN: 19 mg/dL (ref 8–27)
CO2: 21 mmol/L (ref 20–29)
Calcium: 9.1 mg/dL (ref 8.6–10.2)
Chloride: 102 mmol/L (ref 96–106)
Creatinine, Ser: 1.2 mg/dL (ref 0.76–1.27)
GFR, EST AFRICAN AMERICAN: 72 mL/min/{1.73_m2} (ref >59)
GFR, EST NON AFRICAN AMERICAN: 62 mL/min/{1.73_m2} (ref >59)
Glucose: 98 mg/dL (ref 65–99)
Potassium: 3.8 mmol/L (ref 3.5–5.2)
Sodium: 140 mmol/L (ref 134–144)

## 2019-01-24 ENCOUNTER — Telehealth: Payer: Self-pay | Admitting: Family Medicine

## 2019-01-24 MED ORDER — AMOXICILLIN-POT CLAVULANATE 875-125 MG PO TABS: 1.0000 | ORAL_TABLET | Freq: Two times a day (BID) | ORAL | 0 refills | Status: DC

## 2019-01-24 NOTE — Telephone Encounter (Signed)
Copied from [place]  [phone]. Topic: General - Other >> Jan 24, 2019  9:28 AM Lennox Solders wrote: Reason for CRM: pt decline to see another provider . Pt having sinus drainage ,itchy throat  and cough. The symptoms started on Tuesday. Pt would [facility] abx [facility]. Pt saw dr Jeananne Rama on 01-21-2019

## 2019-02-24 ENCOUNTER — Other Ambulatory Visit: Payer: Self-pay | Admitting: Family Medicine

## 2019-04-03 ENCOUNTER — Telehealth: Payer: Self-pay | Admitting: Family Medicine

## 2019-04-03 NOTE — Telephone Encounter (Signed)
Called patient and LVM to re-schedule his AWV on 07/08/2019

## 2019-05-17 ENCOUNTER — Other Ambulatory Visit: Payer: Self-pay | Admitting: Family Medicine

## 2019-05-17 NOTE — Telephone Encounter (Signed)
Requested Prescriptions  Pending Prescriptions Disp Refills  . diclofenac sodium (VOLTAREN) 1 % GEL [Pharmacy Med Name: DICLOFENAC SODIUM 1% GEL] 300 g 1    Sig: APPLY 4 GMS OF GEL TO AFFECTED SKIN FOUR TIMES DAILY     Analgesics:  Topicals Passed - 05/17/2019 10:08 AM      Passed - Valid encounter within last 12 months    Recent Outpatient Visits          3 months ago Essential hypertension   [place] Crissman, Mark A, MD   7 months ago Type 2 diabetes mellitus with peripheral neuropathy ([place])   [facility] Crissman, Jeannette How, MD   10 months ago Gout of knee, unspecified cause, unspecified chronicity, unspecified laterality   [facility] Crissman, Jeannette How, MD   1 year ago Essential hypertension   [place] Crissman, Jeannette How, MD   1 year ago Essential hypertension   [place], Jeannette How, MD      Future Appointments            In 1 month  [place]   In 2 months Crissman, Jeannette How, MD [facility], [place]

## 2019-05-27 DIAGNOSIS — H2513 Age-related nuclear cataract, bilateral: Secondary | ICD-10-CM | POA: Diagnosis not present

## 2019-05-27 DIAGNOSIS — H5203 Hypermetropia, bilateral: Secondary | ICD-10-CM | POA: Diagnosis not present

## 2019-05-27 DIAGNOSIS — H1045 Other chronic allergic conjunctivitis: Secondary | ICD-10-CM | POA: Diagnosis not present

## 2019-05-27 DIAGNOSIS — E113293 Type 2 diabetes mellitus with mild nonproliferative diabetic retinopathy without macular edema, bilateral: Secondary | ICD-10-CM | POA: Diagnosis not present

## 2019-05-27 LAB — HM DIABETES EYE EXAM

## 2019-06-04 ENCOUNTER — Encounter: Payer: Self-pay | Admitting: Family Medicine

## 2019-07-08 ENCOUNTER — Ambulatory Visit: Payer: PPO

## 2019-07-10 ENCOUNTER — Ambulatory Visit (INDEPENDENT_AMBULATORY_CARE_PROVIDER_SITE_OTHER): Payer: PPO

## 2019-07-10 DIAGNOSIS — Z0000 Encounter for general adult medical examination without abnormal findings: Secondary | ICD-10-CM

## 2019-07-10 NOTE — Patient Instructions (Signed)
Jerry Pollard , Thank you for taking time to come for your Medicare Wellness Visit. I appreciate your ongoing commitment to your health goals. Please review the following plan we discussed and let me know if I can assist you in the future.   Screening recommendations/referrals: Colonoscopy: completed 10/08/2018 Recommended yearly ophthalmology/optometry visit for glaucoma screening and checkup Recommended yearly dental visit for hygiene and checkup  Vaccinations: Influenza vaccine: up to date  Pneumococcal vaccine: up to date Tdap vaccine: up to date Shingles vaccine: shingrix eligible, check with your insurance company   Advanced directives: Please bring a copy of your health care power of attorney and living will to the office at your convenience.  Conditions/risks identified: none  Next appointment: Follow up in one year for your annual wellness visit.   Preventive Care 68 Years and Older, Male Preventive care refers to lifestyle choices and visits with your health care provider that can promote health and wellness. What does preventive care include?  A yearly physical exam. This is also called an annual well check.  Dental exams once or twice a year.  Routine eye exams. Ask your health care provider how often you should have your eyes checked.  Personal lifestyle choices, including:  Daily care of your teeth and gums.  Regular physical activity.  Eating a healthy diet.  Avoiding tobacco and drug use.  Limiting alcohol use.  Practicing safe sex.  Taking low doses of aspirin every day.  Taking vitamin and mineral supplements as recommended by your health care provider. What happens during an annual well check? The services and screenings done by your health care provider during your annual well check will depend on your age, overall health, lifestyle risk factors, and family history of disease. Counseling  Your health care provider may ask you questions about your:   Alcohol use.  Tobacco use.  Drug use.  Emotional well-being.  Home and relationship well-being.  Sexual activity.  Eating habits.  History of falls.  Memory and ability to understand (cognition).  Work and work Statistician. Screening  You may have the following tests or measurements:  Height, weight, and BMI.  Blood pressure.  Lipid and cholesterol levels. These may be checked every 5 years, or more frequently if you are over 68 years old.  Skin check.  Lung cancer screening. You may have this screening every year starting at age 68 if you have a 30-pack-year history of smoking and currently smoke or have quit within the past 15 years.  Fecal occult blood test (FOBT) of the stool. You may have this test every year starting at age 68.  Flexible sigmoidoscopy or colonoscopy. You may have a sigmoidoscopy every 5 years or a colonoscopy every 10 years starting at age 68.  Prostate cancer screening. Recommendations will vary depending on your family history and other risks.  Hepatitis C blood test.  Hepatitis B blood test.  Sexually transmitted disease (STD) testing.  Diabetes screening. This is done by checking your blood sugar (glucose) after you have not eaten for a while (fasting). You may have this done every 1-3 years.  Abdominal aortic aneurysm (AAA) screening. You may need this if you are a current or former smoker.  Osteoporosis. You may be screened starting at age 68 if you are at high risk. Talk with your health care provider about your test results, treatment options, and if necessary, the need for more tests. Vaccines  Your health care provider may recommend certain vaccines, such as:  Influenza  vaccine. This is recommended every year.  Tetanus, diphtheria, and acellular pertussis (Tdap, Td) vaccine. You may need a Td booster every 10 years.  Zoster vaccine. You may need this after age 68.  Pneumococcal 13-valent conjugate (PCV13) vaccine. One dose is  recommended after age 80.  Pneumococcal polysaccharide (PPSV23) vaccine. One dose is recommended after age 68. Talk to your health care provider about which screenings and vaccines you need and how often you need them. This information is not intended to replace advice given to you by your health care provider. Make sure you discuss any questions you have with your health care provider. Document Released: 12/25/2015 Document Revised: 08/17/2016 Document Reviewed: 09/29/2015 Elsevier Interactive Patient Education  2017 [place] Prevention in the Home Falls can cause injuries. They can happen to people of all ages. There are many things you can do to make your home safe and to help prevent falls. What can I do on the outside of my home?  Regularly fix the edges of walkways and driveways and fix any cracks.  Remove anything that might make you trip as you walk through a door, such as a raised step or threshold.  Trim any bushes or trees on the path to your home.  Use bright outdoor lighting.  Clear any walking paths of anything that might make someone trip, such as rocks or tools.  Regularly check to see if handrails are loose or broken. Make sure that both sides of any steps have handrails.  Any raised decks and porches should have guardrails on the edges.  Have any leaves, snow, or ice cleared regularly.  Use sand or salt on walking paths during winter.  Clean up any spills in your garage right away. This includes oil or grease spills. What can I do in the bathroom?  Use night lights.  Install grab bars by the toilet and in the tub and shower. Do not use towel bars as grab bars.  Use non-skid mats or decals in the tub or shower.  If you need to sit down in the shower, use a plastic, non-slip stool.  Keep the floor dry. Clean up any water that spills on the floor as soon as it happens.  Remove soap buildup in the tub or shower regularly.  Attach bath mats  securely with double-sided non-slip rug tape.  Do not have throw rugs and other things on the floor that can make you trip. What can I do in the bedroom?  Use night lights.  Make sure that you have a light by your bed that is easy to reach.  Do not use any sheets or blankets that are too big for your bed. They should not hang down onto the floor.  Have a firm chair that has side arms. You can use this for support while you get dressed.  Do not have throw rugs and other things on the floor that can make you trip. What can I do in the kitchen?  Clean up any spills right away.  Avoid walking on wet floors.  Keep items that you use a lot in easy-to-reach places.  If you need to reach something above you, use a strong step stool that has a grab bar.  Keep electrical cords out of the way.  Do not use floor polish or wax that makes floors slippery. If you must use wax, use non-skid floor wax.  Do not have throw rugs and other things on the floor that can  make you trip. What can I do with my stairs?  Do not leave any items on the stairs.  Make sure that there are handrails on both sides of the stairs and use them. Fix handrails that are broken or loose. Make sure that handrails are as long as the stairways.  Check any carpeting to make sure that it is firmly attached to the stairs. Fix any carpet that is loose or worn.  Avoid having throw rugs at the top or bottom of the stairs. If you do have throw rugs, attach them to the floor with carpet tape.  Make sure that you have a light switch at the top of the stairs and the bottom of the stairs. If you do not have them, ask someone to add them for you. What else can I do to help prevent falls?  Wear shoes that:  Do not have high heels.  Have rubber bottoms.  Are comfortable and fit you well.  Are closed at the toe. Do not wear sandals.  If you use a stepladder:  Make sure that it is fully opened. Do not climb a closed  stepladder.  Make sure that both sides of the stepladder are locked into place.  Ask someone to hold it for you, if possible.  Clearly mark and make sure that you can see:  Any grab bars or handrails.  First and last steps.  Where the edge of each step is.  Use tools that help you move around (mobility aids) if they are needed. These include:  Canes.  Walkers.  Scooters.  Crutches.  Turn on the lights when you go into a dark area. Replace any light bulbs as soon as they burn out.  Set up your furniture so you have a clear path. Avoid moving your furniture around.  If any of your floors are uneven, fix them.  If there are any pets around you, be aware of where they are.  Review your medicines with your doctor. Some medicines can make you feel dizzy. This can increase your chance of falling. Ask your doctor what other things that you can do to help prevent falls. This information is not intended to replace advice given to you by your health care provider. Make sure you discuss any questions you have with your health care provider. Document Released: 09/24/2009 Document Revised: 05/05/2016 Document Reviewed: 01/02/2015 Elsevier Interactive Patient Education  2017 [employer].

## 2019-07-10 NOTE — Progress Notes (Signed)
Subjective:   Jerry Pollard is a 68 y.o. male who presents for Medicare Annual/Subsequent preventive examination.  This visit is being conducted via phone call  - after an attmept to do on video chat - due to the COVID-19 pandemic. This patient has given me verbal consent via phone to conduct this visit, patient states they are participating from their home address. Some vital signs may be absent or patient reported.   Patient identification: identified by name, DOB, and current address.    Review of Systems:   Cardiac Risk Factors include: advanced age (>86men, >31 women);hypertension;male gender;dyslipidemia;diabetes mellitus     Objective:    Vitals: There were no vitals taken for this visit.  There is no height or weight on file to calculate BMI.  Advanced Directives 07/10/2019 10/08/2018 06/29/2018 11/20/2017 06/28/2017  Does Patient Have a Medical Advance Directive? Yes Yes Yes Yes Yes  Type of Advance Directive Living will;Healthcare Power of Attorney - Living will;Healthcare Power of [place];Living will  Copy of [place] in Chart? No - copy requested - No - copy requested - No - copy requested    Tobacco Social History   Tobacco Use  Smoking Status Former Smoker  . Types: Cigarettes  . Quit date: 07/12/1997  . Years since quitting: 22.0  Smokeless Tobacco Never Used     Counseling given: Not Answered   Clinical Intake:  Pre-visit preparation completed: Yes  Pain : No/denies pain     Nutritional Risks: None Diabetes: Yes CBG done?: No Did pt. bring in CBG monitor from home?: No  How often do you need to have someone help you when you read instructions, pamphlets, or other written materials from your doctor or pharmacy?: 1 - Never  Nutrition Risk Assessment:  Has the patient had any N/V/D within the last 2 months?  No  Does the patient have any non-healing wounds?  No  Has the patient had any unintentional  weight loss or weight gain?  No   Diabetes:  Is the patient diabetic?  Yes  If diabetic, was a CBG obtained today?  No  Did the patient bring in their glucometer from home?  No  How often do you monitor your CBG's? 3-4 times a day .   Financial Strains and Diabetes Management:  Are you having any financial strains with the device, your supplies or your medication? Not currently  Does the patient want to be seen by Chronic Care Management for management of their diabetes?  Not currently   Would the patient like to be referred to a Nutritionist or for Diabetic Management?  Yes   Diabetic Exams:  Diabetic Eye Exam: Completed 05/27/2019.  Diabetic Foot Exam: . Pt has been advised about the importance in completing this exam.   Interpreter Needed?: No  Information entered by :: [name],LPN  Past Medical History:  Diagnosis Date  . Arthritis   . Chronic kidney disease   . Depression   . Diabetes mellitus without complication ([place])   . GERD (gastroesophageal reflux disease)   . Gout   . Hyperlipidemia   . Hypertension    Past Surgical History:  Procedure Laterality Date  . APPENDECTOMY    . BACK SURGERY    . CARPAL TUNNEL RELEASE Right   . CHOLECYSTECTOMY    . COLONOSCOPY    . COLONOSCOPY WITH PROPOFOL N/A 10/08/2018   Procedure: COLONOSCOPY WITH PROPOFOL;  Surgeon: [name], MD;  Location:  [facility];  Service: Endoscopy;  Laterality: N/A;  . SPINE SURGERY     Family History  Problem Relation Age of Onset  . Hypertension Mother   . Cancer Mother   . Cancer Father   . Heart attack Brother    Social History   Socioeconomic History  . Marital status: Single    Spouse name: Not on file  . Number of children: Not on file  . Years of education: Not on file  . Highest education level: High school graduate  Occupational History  . Not on file  Social Needs  . Financial resource strain: Not hard at all  . Food insecurity    Worry: Never true     Inability: Never true  . Transportation needs    Medical: No    Non-medical: No  Tobacco Use  . Smoking status: Former Smoker    Types: Cigarettes    Quit date: 07/12/1997    Years since quitting: 22.0  . Smokeless tobacco: Never Used  Substance and Sexual Activity  . Alcohol use: No  . Drug use: No  . Sexual activity: Not on file  Lifestyle  . Physical activity    Days per week: 0 days    Minutes per session: 0 min  . Stress: Not at all  Relationships  . Social connections    Talks on phone: More than three times a week    Gets together: More than three times a week    Attends religious service: More than 4 times per year    Active member of club or organization: Yes    Attends meetings of clubs or organizations: More than 4 times per year    Relationship status: Not on file  Other Topics Concern  . Not on file  Social History Narrative   Working part time     Outpatient Encounter Medications as of 07/10/2019  Medication Sig  . allopurinol (ZYLOPRIM) 100 MG tablet Take 1 tablet (100 mg total) by mouth daily.  Marland Kitchen amLODipine (NORVASC) 10 MG tablet Take 1 tablet (10 mg total) by mouth daily.  Marland Kitchen atorvastatin (LIPITOR) 10 MG tablet Take 1 tablet (10 mg total) by mouth daily. (Patient taking differently: Take 10 mg by mouth daily. Every other day)  . benazepril (LOTENSIN) 40 MG tablet Take 1 tablet (40 mg total) by mouth daily.  . colchicine 0.6 MG tablet Take 1 tablet (0.6 mg total) by mouth daily.  . diclofenac sodium (VOLTAREN) 1 % GEL Apply 2 g topically 4 (four) times daily.  . fexofenadine (ALLEGRA) 180 MG tablet Take 180 mg by mouth daily.  Marland Kitchen gabapentin (NEURONTIN) 300 MG capsule Take 1 capsule (300 mg total) by mouth 2 (two) times daily.  Marland Kitchen glucose blood (ONE TOUCH ULTRA TEST) test strip 100 each by Other route See admin instructions. Use as instructed  . insulin aspart (NOVOLOG FLEXPEN) 100 UNIT/ML FlexPen INJECT 50-60 UNITS UNDER THE SKIN 3 TIMES A DAY (Patient taking  differently: INJECT 18 UNITS UNDER THE SKIN 2 TIMES A DAY Sliding scale)  . Lancets (ONETOUCH ULTRASOFT) lancets Use as instructed  . LEVEMIR FLEXTOUCH 100 UNIT/ML Pen INJECT 50 UNITS INTO THE SKIN DAILY AT 10 PM. (Patient taking differently: Inject 25 Units into the skin daily at 10 pm. novolin nph Twice a day)  . metFORMIN (GLUCOPHAGE-XR) 500 MG 24 hr tablet Take 1 tablet (500 mg total) by mouth 3 (three) times daily. (Patient taking differently: Take 500 mg by mouth 3 (three) times  daily. 2 in am 1 in pm)  . pantoprazole (PROTONIX) 40 MG tablet Take 1 tablet (40 mg total) by mouth daily.  . [DISCONTINUED] diclofenac sodium (VOLTAREN) 1 % GEL APPLY 4 GMS OF GEL TO AFFECTED SKIN FOUR TIMES DAILY  . fluticasone (FLONASE) 50 MCG/ACT nasal spray Place 2 sprays into both nostrils daily. (Patient not taking: Reported on 07/10/2019)  . traMADol (ULTRAM) 50 MG tablet Take 1 tablet (50 mg total) by mouth daily as needed. (Patient not taking: Reported on 07/10/2019)  . [DISCONTINUED] amoxicillin-clavulanate (AUGMENTIN) 875-125 MG tablet Take 1 tablet by mouth 2 (two) times daily. (Patient not taking: Reported on 07/10/2019)   No facility-administered encounter medications on file as of 07/10/2019.     Activities of Daily Living In your present state of health, do you have any difficulty performing the following activities: 07/10/2019  Hearing? N  Comment no hearing aids  Vision? N  Comment sees dr.woodard. reading glasses  Difficulty concentrating or making decisions? N  Walking or climbing stairs? Y  Comment knee pain with a lot of flights  Dressing or bathing? N  Doing errands, shopping? N  Preparing Food and eating ? N  Using the Toilet? N  In the past six months, have you accidently leaked urine? N  Do you have problems with loss of bowel control? N  Managing your Medications? N  Managing your Finances? N  Housekeeping or managing your Housekeeping? N  Some recent data might be hidden     Patient Care Team: Guadalupe Maple, MD as PCP - General (Family Medicine) Emmaline Kluver., MD (Rheumatology) Leanor Kail, MD (Inactive) (Orthopedic Surgery)   Assessment:   This is a routine wellness examination for Jakylan.  Exercise Activities and Dietary recommendations Current Exercise Habits: The patient does not participate in regular exercise at present, Exercise limited by: None identified  Goals    . DIET - INCREASE WATER INTAKE     recommend drinking at least 6-8 glasses of water a day     . Increase water intake     Recommend drinking at least 6-8 glasses of water a day        Fall Risk Fall Risk  07/10/2019 01/21/2019 06/29/2018 04/11/2018 02/01/2018  Falls in the past year? 0 0 No No No  Number falls in past yr: - - - - -  Injury with Fall? - - - - -  Follow up - Falls evaluation completed - - -   FALL RISK PREVENTION PERTAINING TO THE HOME:  Any stairs in or around the home? No  If so, are there any without handrails? No   Home free of loose throw rugs in walkways, pet beds, electrical cords, etc? Yes  Adequate lighting in your home to reduce risk of falls? Yes   ASSISTIVE DEVICES UTILIZED TO PREVENT FALLS:  Life alert? No  Use of a cane, walker or w/c? No  Grab bars in the bathroom? No  Shower chair or bench in shower? No  Elevated toilet seat or a handicapped toilet? No    TIMED UP AND GO: Unable to perform    Depression Screen PHQ 2/9 Scores 07/10/2019 06/29/2018 04/11/2018 02/01/2018  PHQ - 2 Score 0 0 0 0    Cognitive Function     6CIT Screen 07/10/2019 06/29/2018 06/28/2017  What Year? 0 points 0 points 0 points  What month? 0 points 0 points 0 points  What time? 0 points 0 points 0 points  Count back  from 20 0 points 0 points 0 points  Months in reverse 0 points 0 points 0 points  Repeat phrase 0 points 2 points 2 points  Total Score 0 2 2    Immunization History  Administered Date(s) Administered  . Influenza, High Dose Seasonal  PF 09/11/2017, 08/09/2018  . Influenza-Unspecified 09/16/2014, 09/08/2015, 08/13/2016  . Pneumococcal Conjugate-13 05/30/2016  . Pneumococcal Polysaccharide-23 06/28/2017  . Td 09/13/2005  . Tdap 05/30/2016    Qualifies for Shingles Vaccine? Yes  Zostavax completed n/a. Due for Shingrix. Education has been provided regarding the importance of this vaccine. Pt has been advised to call insurance company to determine out of pocket expense. Advised may also receive vaccine at local pharmacy or Health Dept. Verbalized acceptance and understanding.  Tdap: up to date   Flu Vaccine: up to date   Pneumococcal Vaccine: up to date   Screening Tests Health Maintenance  Topic Date Due  . FOOT EXAM  07/03/2018  . INFLUENZA VACCINE  07/13/2019  . HEMOGLOBIN A1C  07/22/2019  . OPHTHALMOLOGY EXAM  05/26/2020  . COLONOSCOPY  10/08/2020  . TETANUS/TDAP  05/30/2026  . Hepatitis C Screening  Completed  . PNA vac Low Risk Adult  Completed   Cancer Screenings:  Colorectal Screening: Completed 10/08/2018. Repeat every 2 years;   Lung Cancer Screening: (Low Dose CT Chest recommended if Age 63-80 years, 30 pack-year currently smoking OR have quit w/in 15years.) does not qualify.    Additional Screening:  Hepatitis C Screening: does qualify; Completed 02/10/2016  Dental Screening: Recommended annual dental exams for proper oral hygiene  Community Resource Referral:  CRR required this visit?  No        Plan:  I have personally reviewed and addressed the Medicare Annual Wellness questionnaire and have noted the following in the patient's chart:  A. Medical and social history B. Use of alcohol, tobacco or illicit drugs  C. Current medications and supplements D. Functional ability and status E.  Nutritional status F.  Physical activity G. Advance directives H. List of other physicians I.  Hospitalizations, surgeries, and ER visits in previous 12 months J.  [place] such as  hearing and vision if needed, cognitive and depression L. Referrals and appointments   In addition, I have reviewed and discussed with patient certain preventive protocols, quality metrics, and best practice recommendations. A written personalized care plan for preventive services as well as general preventive health recommendations were provided to patient.   Signed,   Bevelyn Ngo, LPN  [id number] Nurse Health Advisor  Nurse Notes: none

## 2019-07-15 ENCOUNTER — Other Ambulatory Visit: Payer: Self-pay | Admitting: Family Medicine

## 2019-07-20 ENCOUNTER — Other Ambulatory Visit: Payer: Self-pay | Admitting: Family Medicine

## 2019-07-20 DIAGNOSIS — I10 Essential (primary) hypertension: Secondary | ICD-10-CM

## 2019-07-20 NOTE — Telephone Encounter (Signed)
Requested Prescriptions  Pending Prescriptions Disp Refills  . amLODipine (NORVASC) 10 MG tablet [Pharmacy Med Name: AMLODIPINE BESYLATE 10 MG TAB] 90 tablet 4    Sig: TAKE 1 TABLET BY MOUTH EVERY DAY     Cardiovascular:  Calcium Channel Blockers Passed - 07/20/2019  9:23 AM      Passed - Last BP in normal range    BP Readings from Last 1 Encounters:  01/21/19 138/78         Passed - Valid encounter within last 6 months    Recent Outpatient Visits          6 months ago Essential hypertension   [facility], Jeannette How, MD   9 months ago Type 2 diabetes mellitus with peripheral neuropathy ([place])   [facility], Jeannette How, MD   1 year ago Gout of knee, unspecified cause, unspecified chronicity, unspecified laterality   [facility] Cr[facility], Jeannette How, MD   1 year ago Essential hypertension   [place], Jeannette How, MD   1 year ago Essential hypertension   [place], MD      Future Appointments            In 2 weeks Crissman, Jeannette How, MD [facility] [place]   In 3 weeks Orene Desanctis, Lilia Argue, PA-C [facility]

## 2019-07-22 ENCOUNTER — Encounter: Payer: PPO | Admitting: Family Medicine

## 2019-08-05 ENCOUNTER — Encounter: Payer: Self-pay | Admitting: Family Medicine

## 2019-08-05 ENCOUNTER — Other Ambulatory Visit: Payer: Self-pay

## 2019-08-05 ENCOUNTER — Ambulatory Visit (INDEPENDENT_AMBULATORY_CARE_PROVIDER_SITE_OTHER): Payer: PPO | Admitting: Family Medicine

## 2019-08-05 DIAGNOSIS — M109 Gout, unspecified: Secondary | ICD-10-CM

## 2019-08-05 DIAGNOSIS — N400 Enlarged prostate without lower urinary tract symptoms: Secondary | ICD-10-CM | POA: Diagnosis not present

## 2019-08-05 DIAGNOSIS — N183 Chronic kidney disease, stage 3 (moderate): Secondary | ICD-10-CM

## 2019-08-05 DIAGNOSIS — E1122 Type 2 diabetes mellitus with diabetic chronic kidney disease: Secondary | ICD-10-CM

## 2019-08-05 DIAGNOSIS — I10 Essential (primary) hypertension: Secondary | ICD-10-CM

## 2019-08-05 DIAGNOSIS — E785 Hyperlipidemia, unspecified: Secondary | ICD-10-CM

## 2019-08-05 DIAGNOSIS — M1990 Unspecified osteoarthritis, unspecified site: Secondary | ICD-10-CM

## 2019-08-05 DIAGNOSIS — E1142 Type 2 diabetes mellitus with diabetic polyneuropathy: Secondary | ICD-10-CM

## 2019-08-05 DIAGNOSIS — Z7189 Other specified counseling: Secondary | ICD-10-CM | POA: Diagnosis not present

## 2019-08-05 MED ORDER — PANTOPRAZOLE SODIUM 40 MG PO TBEC: 40.0000 mg | DELAYED_RELEASE_TABLET | Freq: Every day | ORAL | 4 refills | Status: DC

## 2019-08-05 MED ORDER — ATORVASTATIN CALCIUM 10 MG PO TABS: 10.0000 mg | ORAL_TABLET | Freq: Every day | ORAL | 4 refills | Status: AC

## 2019-08-05 MED ORDER — DICLOFENAC SODIUM 1 % TD GEL: 2.0000 g | Freq: Four times a day (QID) | TRANSDERMAL | 6 refills | Status: DC

## 2019-08-05 MED ORDER — TRAMADOL HCL 50 MG PO TABS: 50.0000 mg | ORAL_TABLET | Freq: Every day | ORAL | 1 refills | Status: DC | PRN

## 2019-08-05 MED ORDER — METFORMIN HCL ER 500 MG PO TB24: 500.0000 mg | ORAL_TABLET | Freq: Three times a day (TID) | ORAL | 4 refills | Status: DC

## 2019-08-05 MED ORDER — AMLODIPINE BESYLATE 10 MG PO TABS: 10.0000 mg | ORAL_TABLET | Freq: Every day | ORAL | 4 refills | Status: DC

## 2019-08-05 MED ORDER — GABAPENTIN 300 MG PO CAPS: 300.0000 mg | ORAL_CAPSULE | Freq: Two times a day (BID) | ORAL | 4 refills | Status: DC

## 2019-08-05 MED ORDER — BENAZEPRIL HCL 40 MG PO TABS: 40.0000 mg | ORAL_TABLET | Freq: Every day | ORAL | 4 refills | Status: DC

## 2019-08-05 MED ORDER — ALLOPURINOL 100 MG PO TABS: 100.0000 mg | ORAL_TABLET | Freq: Every day | ORAL | 4 refills | Status: AC

## 2019-08-05 NOTE — Assessment & Plan Note (Signed)
A voluntary discussion about advanced care planning including explanation and discussion of advanced directives was extentively discussed with the patient.  Explained about the healthcare proxy and living will was reviewed and packet with forms with expiration of how to fill them out was given.  Time spent: Encounter 16+ min individuals present: Patient 

## 2019-08-05 NOTE — Progress Notes (Addendum)
There were no vitals taken for this visit.   Subjective:    Patient ID: Jerry Pollard, male    DOB: August 23, 1951, 68 y.o.   MRN: [id number]  HPI: Jerry Pollard is a 68 y.o. male  Med check  Relevant past medical, surgical, family and social history reviewed and updated as indicated. Interim medical history since our last visit reviewed. Allergies and medications reviewed and updated.  Review of Systems  Constitutional: Negative.   Respiratory: Negative.   Cardiovascular: Negative.     Per HPI unless specifically indicated above     Objective:    There were no vitals taken for this visit.  Wt Readings from Last 3 Encounters:  01/21/19 264 lb (119.7 kg)  10/18/18 258 lb 6.4 oz (117.2 kg)  10/08/18 257 lb (116.6 kg)    Physical Exam  Results for orders placed or performed in visit on 05/28/19  [facility] DIABETES EYE EXAM  Result Value Ref Range   HM Diabetic Eye Exam Retinopathy (A) No Retinopathy      Assessment & Plan:   Problem List Items Addressed This Visit      Cardiovascular and Mediastinum   Essential hypertension    The current medical regimen is effective;  continue present plan and medications.       Relevant Medications   atorvastatin (LIPITOR) 10 MG tablet   benazepril (LOTENSIN) 40 MG tablet   amLODipine (NORVASC) 10 MG tablet   Other Relevant Orders   CBC with Differential/Platelet   TSH   Urinalysis, Routine w reflex microscopic     Endocrine   Type 2 diabetes mellitus with peripheral neuropathy (HCC)    The current medical regimen is effective;  continue present plan and medications.       Relevant Medications   atorvastatin (LIPITOR) 10 MG tablet   gabapentin (NEURONTIN) 300 MG capsule   benazepril (LOTENSIN) 40 MG tablet   metFORMIN (GLUCOPHAGE-XR) 500 MG 24 hr tablet   Other Relevant Orders   Comprehensive metabolic panel   CBC with Differential/Platelet   TSH   Bayer DCA Hb A1c Waived   CKD stage 3 due to type 2 diabetes mellitus  ([place])    The current medical regimen is effective;  continue present plan and medications.       Relevant Medications   atorvastatin (LIPITOR) 10 MG tablet   benazepril (LOTENSIN) 40 MG tablet   metFORMIN (GLUCOPHAGE-XR) 500 MG 24 hr tablet     Musculoskeletal and Integument   Arthritis   Relevant Medications   allopurinol (ZYLOPRIM) 100 MG tablet   traMADol (ULTRAM) 50 MG tablet     Genitourinary   BPH (benign prostatic hyperplasia)    The current medical regimen is effective;  continue present plan and medications.       Relevant Orders   PSA     Other   Hyperlipidemia   Relevant Medications   atorvastatin (LIPITOR) 10 MG tablet   benazepril (LOTENSIN) 40 MG tablet   amLODipine (NORVASC) 10 MG tablet   Other Relevant Orders   Lipid panel   CBC with Differential/Platelet   TSH   Gout    The current medical regimen is effective;  continue present plan and medications.       Relevant Medications   allopurinol (ZYLOPRIM) 100 MG tablet   Other Relevant Orders   Uric acid   Advanced care planning/counseling discussion    A voluntary discussion about advanced care planning including explanation and discussion of advanced  directives was extentively discussed with the patient.  Explained about the healthcare proxy and living will was reviewed and packet with forms with expiration of how to fill them out was given.  Time spent: Encounter 16+ min individuals present: Patient       Patient also expresses an interest in a male doctor successful in recruitment wants to be referred to Dr. Caryl Comes at [place] [facility]. Telemedicine using audio/video telecommunications for a synchronous communication visit. Today's visit due to COVID-19 isolation precautions I connected with and verified that I am speaking with the correct person using two identifiers.   I discussed the limitations, risks, security and privacy concerns of performing an evaluation and management service by  telecommunication and the availability of in person appointments. I also discussed with the patient that there may be a patient responsible charge related to this service. The patient expressed understanding and agreed to proceed. The patient's location is home. I am at home.   I discussed the assessment and treatment plan with the patient. The patient was provided an opportunity to ask questions and all were answered. The patient agreed with the plan and demonstrated an understanding of the instructions.   The patient was advised to call back or seek an in-person evaluation if the symptoms worsen or if the condition fails to improve as anticipated.   I provided 21+ minutes of time during this encounter. Follow up plan: Return for Physical Exam, has apt and in 6 mo, Hemoglobin A1c, BMP,  Lipids, ALT, AST.

## 2019-08-05 NOTE — Assessment & Plan Note (Signed)
The current medical regimen is effective;  continue present plan and medications.  

## 2019-08-16 ENCOUNTER — Other Ambulatory Visit: Payer: Self-pay

## 2019-08-16 ENCOUNTER — Encounter: Payer: Self-pay | Admitting: Family Medicine

## 2019-08-16 ENCOUNTER — Ambulatory Visit (INDEPENDENT_AMBULATORY_CARE_PROVIDER_SITE_OTHER): Payer: PPO | Admitting: Family Medicine

## 2019-08-16 VITALS — BP 132/70 | HR 74 | Temp 97.9°F | Ht 71.5 in | Wt 263.0 lb

## 2019-08-16 DIAGNOSIS — I10 Essential (primary) hypertension: Secondary | ICD-10-CM | POA: Diagnosis not present

## 2019-08-16 DIAGNOSIS — M109 Gout, unspecified: Secondary | ICD-10-CM | POA: Diagnosis not present

## 2019-08-16 DIAGNOSIS — N400 Enlarged prostate without lower urinary tract symptoms: Secondary | ICD-10-CM

## 2019-08-16 DIAGNOSIS — Z0000 Encounter for general adult medical examination without abnormal findings: Secondary | ICD-10-CM

## 2019-08-16 DIAGNOSIS — E785 Hyperlipidemia, unspecified: Secondary | ICD-10-CM

## 2019-08-16 DIAGNOSIS — E1142 Type 2 diabetes mellitus with diabetic polyneuropathy: Secondary | ICD-10-CM

## 2019-08-16 LAB — URINALYSIS, ROUTINE W REFLEX MICROSCOPIC
Bilirubin, UA: NEGATIVE
Glucose, UA: NEGATIVE
Ketones, UA: NEGATIVE
Leukocytes,UA: NEGATIVE
Nitrite, UA: NEGATIVE
Specific Gravity, UA: 1.025 (ref 1.005–1.030)
Urobilinogen, Ur: 0.2 mg/dL (ref 0.2–1.0)
pH, UA: 5 (ref 5.0–7.5)

## 2019-08-16 LAB — MICROSCOPIC EXAMINATION
Bacteria, UA: NONE SEEN
WBC, UA: NONE SEEN /hpf (ref 0–5)

## 2019-08-16 LAB — BAYER DCA HB A1C WAIVED: HB A1C (BAYER DCA - WAIVED): 7 % — ABNORMAL HIGH (ref <7.0)

## 2019-08-16 NOTE — Progress Notes (Signed)
BP 132/70   Pulse 74   Temp 97.9 F (36.6 C) (Oral)   Ht 5' 11.5" (1.816 m)   Wt 263 lb (119.3 kg)   SpO2 96%   BMI 36.17 kg/m    Subjective:    Patient ID: Jerry Pollard, male    DOB: 02-02-1951, 68 y.o.   MRN: [id number]  HPI: Jerry Pollard is a 68 y.o. male presenting on 08/16/2019 for comprehensive medical examination. Current medical complaints include:none  He currently lives with: Interim Problems from his last visit: no  Depression Screen done today and results listed below:  Depression screen [facility] 2/9 08/16/2019 07/10/2019 06/29/2018 04/11/2018 02/01/2018  Decreased Interest 0 0 0 0 0  Down, Depressed, Hopeless 0 0 0 0 0  PHQ - 2 Score 0 0 0 0 0  Altered sleeping 0 - - - -  Tired, decreased energy 0 - - - -  Change in appetite 0 - - - -  Feeling bad or failure about yourself  0 - - - -  Trouble concentrating 0 - - - -  Moving slowly or fidgety/restless 0 - - - -  Suicidal thoughts 0 - - - -  PHQ-9 Score 0 - - - -    The patient does not have a history of falls. I did complete a risk assessment for falls. A plan of care for falls was documented.   Past Medical History:  Past Medical History:  Diagnosis Date  . Arthritis   . Chronic kidney disease   . Depression   . Diabetes mellitus without complication ([place])   . GERD (gastroesophageal reflux disease)   . Gout   . Hyperlipidemia   . Hypertension     Surgical History:  Past Surgical History:  Procedure Laterality Date  . APPENDECTOMY    . BACK SURGERY    . CARPAL TUNNEL RELEASE Right   . CHOLECYSTECTOMY    . COLONOSCOPY    . COLONOSCOPY WITH PROPOFOL N/A 10/08/2018   Procedure: COLONOSCOPY WITH PROPOFOL;  Surgeon: Jerry Silvas, MD;  Location[facility] ENDOSCOPY;  Service: Endoscopy;  Laterality: N/A;  . SPINE SURGERY      Medications:  Current Outpatient Medications on File Prior to Visit  Medication Sig  . allopurinol (ZYLOPRIM) 100 MG tablet Take 1 tablet (100 mg total) by mouth daily.  Marland Kitchen amLODipine  (NORVASC) 10 MG tablet Take 1 tablet (10 mg total) by mouth daily.  Marland Kitchen atorvastatin (LIPITOR) 10 MG tablet Take 1 tablet (10 mg total) by mouth daily.  . benazepril (LOTENSIN) 40 MG tablet Take 1 tablet (40 mg total) by mouth daily.  . colchicine 0.6 MG tablet Take 1 tablet (0.6 mg total) by mouth daily.  . diclofenac sodium (VOLTAREN) 1 % GEL Apply 2 g topically 4 (four) times daily.  . fexofenadine (ALLEGRA) 180 MG tablet Take 180 mg by mouth daily.  Marland Kitchen gabapentin (NEURONTIN) 300 MG capsule Take 1 capsule (300 mg total) by mouth 2 (two) times daily.  Marland Kitchen glucose blood (ONE TOUCH ULTRA TEST) test strip 100 each by Other route See admin instructions. Use as instructed  . insulin aspart (NOVOLOG FLEXPEN) 100 UNIT/ML FlexPen INJECT 50-60 UNITS UNDER THE SKIN 3 TIMES A DAY (Patient taking differently: INJECT 18 UNITS UNDER THE SKIN 2 TIMES A DAY Sliding scale)  . insulin NPH Human (NOVOLIN N) 100 UNIT/ML injection Inject into the skin. Injection 24 units in the morning and 24 units at night  . Lancets (ONETOUCH ULTRASOFT)  lancets Use as instructed  . metFORMIN (GLUCOPHAGE-XR) 500 MG 24 hr tablet Take 1 tablet (500 mg total) by mouth 3 (three) times daily.  . pantoprazole (PROTONIX) 40 MG tablet Take 1 tablet (40 mg total) by mouth daily.  . traMADol (ULTRAM) 50 MG tablet Take 1 tablet (50 mg total) by mouth daily as needed.  . fluticasone (FLONASE) 50 MCG/ACT nasal spray Place 2 sprays into both nostrils daily. (Patient not taking: Reported on 08/16/2019)  . FLUZONE HIGH-DOSE QUADRIVALENT 0.7 ML SUSY ADM 0.7ML IM UTD   No current facility-administered medications on file prior to visit.     Allergies:  Allergies  Allergen Reactions  . Crestor [Rosuvastatin] Other (See Comments)    Myalgias     Social History:  Social History   Socioeconomic History  . Marital status: Single    Spouse name: Not on file  . Number of children: Not on file  . Years of education: Not on file  . Highest  education level: High school graduate  Occupational History  . Not on file  Social Needs  . Financial resource strain: Not hard at all  . Food insecurity    Worry: Never true    Inability: Never true  . Transportation needs    Medical: No    Non-medical: No  Tobacco Use  . Smoking status: Former Smoker    Types: Cigarettes    Quit date: 07/12/1997    Years since quitting: 22.1  . Smokeless tobacco: Never Used  Substance and Sexual Activity  . Alcohol use: No  . Drug use: No  . Sexual activity: Not on file  Lifestyle  . Physical activity    Days per week: 0 days    Minutes per session: 0 min  . Stress: Not at all  Relationships  . Social connections    Talks on phone: More than three times a week    Gets together: More than three times a week    Attends religious service: More than 4 times per year    Active member of club or organization: Yes    Attends meetings of clubs or organizations: More than 4 times per year    Relationship status: Not on file  . Intimate partner violence    Fear of current or ex partner: No    Emotionally abused: No    Physically abused: No    Forced sexual activity: No  Other Topics Concern  . Not on file  Social History Narrative   Working part time    Social History   Tobacco Use  Smoking Status Former Smoker  . Types: Cigarettes  . Quit date: 07/12/1997  . Years since quitting: 22.1  Smokeless Tobacco Never Used   Social History   Substance and Sexual Activity  Alcohol Use No    Family History:  Family History  Problem Relation Age of Onset  . Hypertension Mother   . Cancer Mother   . Cancer Father   . Heart attack Brother     Past medical history, surgical history, medications, allergies, family history and social history reviewed with patient today and changes made to appropriate areas of the chart.   Review of Systems - General ROS: negative Psychological ROS: negative Ophthalmic ROS: negative ENT ROS: negative  Allergy and Immunology ROS: negative Hematological and Lymphatic ROS: negative Endocrine ROS: negative Breast ROS: negative for breast lumps Respiratory ROS: no cough, shortness of breath, or wheezing Cardiovascular ROS: no chest pain or dyspnea on  exertion Gastrointestinal ROS: no abdominal pain, change in bowel habits, or black or bloody stools Genito-Urinary ROS: no dysuria, trouble voiding, or hematuria Musculoskeletal ROS: negative Neurological ROS: no TIA or stroke symptoms Dermatological ROS: negative All other ROS negative except what is listed above and in the HPI.      Objective:    BP 132/70   Pulse 74   Temp 97.9 F (36.6 C) (Oral)   Ht 5' 11.5" (1.816 m)   Wt 263 lb (119.3 kg)   SpO2 96%   BMI 36.17 kg/m   Wt Readings from Last 3 Encounters:  08/16/19 263 lb (119.3 kg)  01/21/19 264 lb (119.7 kg)  10/18/18 258 lb 6.4 oz (117.2 kg)    Physical Exam Vitals signs and nursing note reviewed.  Constitutional:      General: He is not in acute distress.    Appearance: He is well-developed.  HENT:     Head: Atraumatic.     Right Ear: Tympanic membrane and external ear normal.     Left Ear: Tympanic membrane and external ear normal.     Nose: Nose normal.     Mouth/Throat:     Mouth: Mucous membranes are moist.     Pharynx: Oropharynx is clear.  Eyes:     General: No scleral icterus.    Conjunctiva/sclera: Conjunctivae normal.     Pupils: Pupils are equal, round, and reactive to light.  Neck:     Musculoskeletal: Normal range of motion and neck supple.  Cardiovascular:     Rate and Rhythm: Normal rate and regular rhythm.     Heart sounds: Normal heart sounds. No murmur.  Pulmonary:     Effort: Pulmonary effort is normal. No respiratory distress.     Breath sounds: Normal breath sounds.  Abdominal:     General: Bowel sounds are normal. There is no distension.     Palpations: Abdomen is soft. There is no mass.     Tenderness: There is no abdominal  tenderness. There is no guarding.  Genitourinary:    Comments: Declines GU exam Musculoskeletal: Normal range of motion.        General: No tenderness.  Skin:    General: Skin is warm and dry.     Findings: No rash.  Neurological:     General: No focal deficit present.     Mental Status: He is alert and oriented to person, place, and time.     Deep Tendon Reflexes: Reflexes are normal and symmetric.  Psychiatric:        Mood and Affect: Mood normal.        Behavior: Behavior normal.        Thought Content: Thought content normal.        Judgment: Judgment normal.     Diabetic Foot Exam - Simple   Simple Foot Form Diabetic Foot exam was performed with the following findings: Yes 08/16/2019  1:03 PM  Visual Inspection No deformities, no ulcerations, no other skin breakdown bilaterally: Yes Sensation Testing Intact to touch and monofilament testing bilaterally: Yes Pulse Check Posterior Tibialis and Dorsalis pulse intact bilaterally: Yes Comments     Results for orders placed or performed in visit on 05/28/19  HM DIABETES EYE EXAM  Result Value Ref Range   HM Diabetic Eye Exam Retinopathy (A) No Retinopathy      Assessment & Plan:   Problem List Items Addressed This Visit      Cardiovascular and Mediastinum   Essential hypertension  Endocrine   Type 2 diabetes mellitus with peripheral neuropathy (HCC)   Relevant Medications   insulin NPH Human (NOVOLIN N) 100 UNIT/ML injection     Genitourinary   BPH (benign prostatic hyperplasia)     Other   Hyperlipidemia   Gout    Other Visit Diagnoses    Annual physical exam    -  Primary       Discussed aspirin prophylaxis for myocardial infarction prevention  LABORATORY TESTING:  Health maintenance labs ordered today as discussed above.   The natural history of prostate cancer and ongoing controversy regarding screening and potential treatment outcomes of prostate cancer has been discussed with the patient. The  meaning of a false positive PSA and a false negative PSA has been discussed. He indicates understanding of the limitations of this screening test and wishes to proceed with screening PSA testing.   IMMUNIZATIONS:   - Tdap: Tetanus vaccination status reviewed: last tetanus booster within 10 years. - Influenza: Up to date - Pneumovax: Up to date - Prevnar: Up to date - HPV: Not applicable - Zostavax vaccine: Refused  SCREENING: - Colonoscopy: Up to date  Discussed with patient purpose of the colonoscopy is to detect colon cancer at curable precancerous or early stages   PATIENT COUNSELING:    Sexuality: Discussed sexually transmitted diseases, partner selection, use of condoms, avoidance of unintended pregnancy  and contraceptive alternatives.   Advised to avoid cigarette smoking.  I discussed with the patient that most people either abstain from alcohol or drink within safe limits (<=14/week and <=4 drinks/occasion for males, <=7/weeks and <= 3 drinks/occasion for females) and that the risk for alcohol disorders and other health effects rises proportionally with the number of drinks per week and how often a drinker exceeds daily limits.  Discussed cessation/primary prevention of drug use and availability of treatment for abuse.   Diet: Encouraged to adjust caloric intake to maintain  or achieve ideal body weight, to reduce intake of dietary saturated fat and total fat, to limit sodium intake by avoiding high sodium foods and not adding table salt, and to maintain adequate dietary potassium and calcium preferably from fresh fruits, vegetables, and low-fat dairy products.    stressed the importance of regular exercise  Injury prevention: Discussed safety belts, safety helmets, smoke detector, smoking near bedding or upholstery.   Dental health: Discussed importance of regular tooth brushing, flossing, and dental visits.   Follow up plan: NEXT PREVENTATIVE PHYSICAL DUE IN 1 YEAR.  Return in about 6 months (around 02/13/2020) for 6 month f/u.

## 2019-08-17 LAB — COMPREHENSIVE METABOLIC PANEL
ALT: 36 IU/L (ref 0–44)
AST: 35 IU/L (ref 0–40)
Albumin/Globulin Ratio: 2 (ref 1.2–2.2)
Albumin: 4.3 g/dL (ref 3.8–4.8)
Alkaline Phosphatase: 109 IU/L (ref 39–117)
BUN/Creatinine Ratio: 16 (ref 10–24)
BUN: 20 mg/dL (ref 8–27)
Bilirubin Total: 0.3 mg/dL (ref 0.0–1.2)
CO2: 21 mmol/L (ref 20–29)
Calcium: 9.4 mg/dL (ref 8.6–10.2)
Chloride: 105 mmol/L (ref 96–106)
Creatinine, Ser: 1.28 mg/dL — ABNORMAL HIGH (ref 0.76–1.27)
GFR calc Af Amer: 66 mL/min/{1.73_m2} (ref >59)
GFR calc non Af Amer: 57 mL/min/{1.73_m2} — ABNORMAL LOW (ref >59)
Globulin, Total: 2.2 g/dL (ref 1.5–4.5)
Glucose: 117 mg/dL — ABNORMAL HIGH (ref 65–99)
Potassium: 4 mmol/L (ref 3.5–5.2)
Sodium: 143 mmol/L (ref 134–144)
Total Protein: 6.5 g/dL (ref 6.0–8.5)

## 2019-08-17 LAB — CBC WITH DIFFERENTIAL/PLATELET
Basophils Absolute: 0.1 10*3/uL (ref 0.0–0.2)
Basos: 1 %
EOS (ABSOLUTE): 0.2 10*3/uL (ref 0.0–0.4)
Eos: 3 %
Hematocrit: 43.6 % (ref 37.5–51.0)
Hemoglobin: 14.6 g/dL (ref 13.0–17.7)
Immature Grans (Abs): 0 10*3/uL (ref 0.0–0.1)
Immature Granulocytes: 1 %
Lymphocytes Absolute: 1.6 10*3/uL (ref 0.7–3.1)
Lymphs: 26 %
MCH: 30.2 pg (ref 26.6–33.0)
MCHC: 33.5 g/dL (ref 31.5–35.7)
MCV: 90 fL (ref 79–97)
Monocytes Absolute: 0.4 10*3/uL (ref 0.1–0.9)
Monocytes: 6 %
Neutrophils Absolute: 3.9 10*3/uL (ref 1.4–7.0)
Neutrophils: 63 %
Platelets: 200 10*3/uL (ref 150–450)
RBC: 4.83 x10E6/uL (ref 4.14–5.80)
RDW: 14.1 % (ref 11.6–15.4)
WBC: 6.1 10*3/uL (ref 3.4–10.8)

## 2019-08-17 LAB — URIC ACID: Uric Acid: 7.5 mg/dL (ref 3.7–8.6)

## 2019-08-17 LAB — LIPID PANEL
Chol/HDL Ratio: 5.5 ratio — ABNORMAL HIGH (ref 0.0–5.0)
Cholesterol, Total: 194 mg/dL (ref 100–199)
HDL: 35 mg/dL — ABNORMAL LOW (ref >39)
LDL Chol Calc (NIH): 69 mg/dL (ref 0–99)
Triglycerides: 581 mg/dL (ref 0–149)
VLDL Cholesterol Cal: 90 mg/dL — ABNORMAL HIGH (ref 5–40)

## 2019-08-17 LAB — PSA: Prostate Specific Ag, Serum: 3.2 ng/mL (ref 0.0–4.0)

## 2019-08-17 LAB — TSH: TSH: 4.93 u[IU]/mL — ABNORMAL HIGH (ref 0.450–4.500)

## 2019-08-19 ENCOUNTER — Other Ambulatory Visit: Payer: Self-pay | Admitting: Family Medicine

## 2019-08-19 ENCOUNTER — Encounter: Payer: Self-pay | Admitting: Family Medicine

## 2019-08-19 DIAGNOSIS — R7989 Other specified abnormal findings of blood chemistry: Secondary | ICD-10-CM

## 2019-08-19 DIAGNOSIS — E7800 Pure hypercholesterolemia, unspecified: Secondary | ICD-10-CM

## 2019-08-27 ENCOUNTER — Encounter: Payer: Self-pay | Admitting: Family Medicine

## 2019-08-27 ENCOUNTER — Other Ambulatory Visit: Payer: Self-pay | Admitting: Family Medicine

## 2019-08-27 MED ORDER — NOVOLOG FLEXPEN 100 UNIT/ML ~~LOC~~ SOPN: PEN_INJECTOR | SUBCUTANEOUS | 11 refills | Status: DC

## 2019-08-30 ENCOUNTER — Other Ambulatory Visit: Payer: Self-pay | Admitting: Family Medicine

## 2019-08-30 ENCOUNTER — Telehealth: Payer: Self-pay | Admitting: Family Medicine

## 2019-08-30 ENCOUNTER — Other Ambulatory Visit: Payer: Self-pay | Admitting: *Deleted

## 2019-08-30 MED ORDER — NOVOLOG FLEXPEN 100 UNIT/ML ~~LOC~~ SOPN: PEN_INJECTOR | SUBCUTANEOUS | 11 refills | Status: DC

## 2019-08-30 NOTE — Progress Notes (Signed)
Med reorder. Pharmacy stated they did not receive last prescription of Novolog Flexpen  that was phoned in on 08/27/2019.

## 2019-08-30 NOTE — Telephone Encounter (Signed)
Medication Refill - Medication: insulin aspart (NOVOLOG FLEXPEN) 100 UNIT/ML FlexPen  Has the patient contacted their pharmacy? Yes - pt was told via [place] 08/27/2019 this has been sent in, but look like order may have not gone through.  Pt only has a half of a pen left that will only get him through tomorrow. (Agent: If no, request that the patient contact the pharmacy for the refill.) (Agent: If yes, when and what did the pharmacy advise?)  Preferred Pharmacy (with phone number or street name):  [id number] - [place], [place] [address] [phone] (Phone) [phone] (Fax)   Agent: Please be advised that RX refills may take up to 3 business days. We ask that you follow-up with your pharmacy.

## 2019-09-03 NOTE — Telephone Encounter (Signed)
Per agent pharmacy is saying it is in wrong.

## 2019-09-03 NOTE — Telephone Encounter (Signed)
Pt called to follow up on this medication. Pt states he does not have anymore of the medication. Pt is concerned. Please advise.

## 2019-09-03 NOTE — Telephone Encounter (Signed)
Routing to provider to edit medication.

## 2019-09-03 NOTE — Telephone Encounter (Signed)
Pharmacy did not have medication at all.  Class on the script was "phone in".   Verbal for Novolog Flexpen given to pharmacist Hilliard Clark at [facility].   Patient notified medication was called in to his pharmacy.  Routing to provider as Juluis Rainier.

## 2019-09-03 NOTE — Telephone Encounter (Signed)
If I am told what is wrong I will correct.

## 2019-10-08 ENCOUNTER — Encounter: Payer: Self-pay | Admitting: Family Medicine

## 2019-10-15 ENCOUNTER — Telehealth: Payer: Self-pay | Admitting: Family Medicine

## 2019-10-15 NOTE — Telephone Encounter (Signed)
Patient notified that he can call and schedule a new patient appt without a referral.

## 2019-10-15 NOTE — Telephone Encounter (Signed)
If patient is changing PCP- he does not need a referral. He just needs to call to make an appointment.

## 2019-10-15 NOTE — Telephone Encounter (Signed)
Copied from [place] [id number]. Topic: Referral - Question >> Oct 14, 2019 10:21 AM Jerry Pollard wrote: Reason for CRM: pt called and states that Dr Jeananne Rama told pt he would refer him to Dr Lequita Halt. Pt calling to check status. Please see October 27th my chart message

## 2019-10-16 DIAGNOSIS — E785 Hyperlipidemia, unspecified: Secondary | ICD-10-CM | POA: Diagnosis not present

## 2019-10-16 DIAGNOSIS — Z125 Encounter for screening for malignant neoplasm of prostate: Secondary | ICD-10-CM | POA: Diagnosis not present

## 2019-10-16 DIAGNOSIS — M5136 Other intervertebral disc degeneration, lumbar region: Secondary | ICD-10-CM | POA: Diagnosis not present

## 2019-10-16 DIAGNOSIS — N183 Chronic kidney disease, stage 3 unspecified: Secondary | ICD-10-CM | POA: Diagnosis not present

## 2019-10-16 DIAGNOSIS — E1142 Type 2 diabetes mellitus with diabetic polyneuropathy: Secondary | ICD-10-CM | POA: Diagnosis not present

## 2019-10-16 DIAGNOSIS — I10 Essential (primary) hypertension: Secondary | ICD-10-CM | POA: Diagnosis not present

## 2019-10-16 DIAGNOSIS — M1A00X0 Idiopathic chronic gout, unspecified site, without tophus (tophi): Secondary | ICD-10-CM | POA: Diagnosis not present

## 2019-10-16 DIAGNOSIS — E1122 Type 2 diabetes mellitus with diabetic chronic kidney disease: Secondary | ICD-10-CM | POA: Diagnosis not present

## 2019-10-16 DIAGNOSIS — Z0000 Encounter for general adult medical examination without abnormal findings: Secondary | ICD-10-CM | POA: Diagnosis not present

## 2019-11-26 ENCOUNTER — Other Ambulatory Visit: Payer: Self-pay

## 2019-11-27 MED ORDER — ONETOUCH ULTRA BLUE VI STRP: 100.0000 | ORAL_STRIP | 12 refills | Status: DC

## 2020-01-20 ENCOUNTER — Telehealth: Payer: Self-pay | Admitting: Family Medicine

## 2020-01-20 NOTE — Chronic Care Management (AMB) (Signed)
  Chronic Care Management   Note  01/20/2020 Name: Jerry Pollard MRN: [id number] DOB: 10/30/1951  Jerry Pollard is a 69 y.o. year old male who is a primary care patient of Crissman, Jeannette How, MD. I reached out to Jerry Pollard by phone today in response to a referral sent by Jerry Pollard health plan.     Jerry Pollard was given information about Chronic Care Management services today including:  1. CCM service includes personalized support from designated clinical staff supervised by his physician, including individualized plan of care and coordination with other care providers 2. 24/7 contact phone numbers for assistance for urgent and routine care needs. 3. Service will only be billed when office clinical staff spend 20 minutes or more in a month to coordinate care. 4. Only one practitioner may furnish and bill the service in a calendar month. 5. The patient may stop CCM services at any time (effective at the end of the month) by phone call to the office staff. 6. The patient will be responsible for cost sharing (co-pay) of up to 20% of the service fee (after annual deductible is met).  Patient did not agree to enrollment in care management services and does not wish to consider at this time.  Follow up plan: The patient has been provided with contact information for the care management team and has been advised to call with any health related questions or concerns.   Noreene Larsson, [place], [place], [place] [address] Direct Dial: [phone] [email or web address] Website: [email or web address]

## 2020-01-23 DIAGNOSIS — Z20822 Contact with and (suspected) exposure to covid-19: Secondary | ICD-10-CM | POA: Diagnosis not present

## 2020-02-17 ENCOUNTER — Ambulatory Visit: Payer: PPO | Admitting: Family Medicine

## 2020-03-09 DIAGNOSIS — M5136 Other intervertebral disc degeneration, lumbar region: Secondary | ICD-10-CM | POA: Diagnosis not present

## 2020-03-09 DIAGNOSIS — E785 Hyperlipidemia, unspecified: Secondary | ICD-10-CM | POA: Diagnosis not present

## 2020-03-09 DIAGNOSIS — E1142 Type 2 diabetes mellitus with diabetic polyneuropathy: Secondary | ICD-10-CM | POA: Diagnosis not present

## 2020-03-09 DIAGNOSIS — E1122 Type 2 diabetes mellitus with diabetic chronic kidney disease: Secondary | ICD-10-CM | POA: Diagnosis not present

## 2020-03-09 DIAGNOSIS — M1A00X0 Idiopathic chronic gout, unspecified site, without tophus (tophi): Secondary | ICD-10-CM | POA: Diagnosis not present

## 2020-03-09 DIAGNOSIS — N183 Chronic kidney disease, stage 3 unspecified: Secondary | ICD-10-CM | POA: Diagnosis not present

## 2020-03-09 DIAGNOSIS — Z125 Encounter for screening for malignant neoplasm of prostate: Secondary | ICD-10-CM | POA: Diagnosis not present

## 2020-03-09 DIAGNOSIS — I10 Essential (primary) hypertension: Secondary | ICD-10-CM | POA: Diagnosis not present

## 2020-03-17 DIAGNOSIS — E1122 Type 2 diabetes mellitus with diabetic chronic kidney disease: Secondary | ICD-10-CM | POA: Diagnosis not present

## 2020-03-17 DIAGNOSIS — Z79899 Other long term (current) drug therapy: Secondary | ICD-10-CM | POA: Diagnosis not present

## 2020-03-17 DIAGNOSIS — N183 Chronic kidney disease, stage 3 unspecified: Secondary | ICD-10-CM | POA: Diagnosis not present

## 2020-03-17 DIAGNOSIS — L989 Disorder of the skin and subcutaneous tissue, unspecified: Secondary | ICD-10-CM | POA: Diagnosis not present

## 2020-03-17 DIAGNOSIS — E1142 Type 2 diabetes mellitus with diabetic polyneuropathy: Secondary | ICD-10-CM | POA: Diagnosis not present

## 2020-03-17 DIAGNOSIS — M1A00X0 Idiopathic chronic gout, unspecified site, without tophus (tophi): Secondary | ICD-10-CM | POA: Diagnosis not present

## 2020-03-17 DIAGNOSIS — M5136 Other intervertebral disc degeneration, lumbar region: Secondary | ICD-10-CM | POA: Diagnosis not present

## 2020-03-17 DIAGNOSIS — I10 Essential (primary) hypertension: Secondary | ICD-10-CM | POA: Diagnosis not present

## 2020-03-17 DIAGNOSIS — E785 Hyperlipidemia, unspecified: Secondary | ICD-10-CM | POA: Diagnosis not present

## 2020-05-22 DIAGNOSIS — D2262 Melanocytic nevi of left upper limb, including shoulder: Secondary | ICD-10-CM | POA: Diagnosis not present

## 2020-05-22 DIAGNOSIS — D225 Melanocytic nevi of trunk: Secondary | ICD-10-CM | POA: Diagnosis not present

## 2020-05-22 DIAGNOSIS — D224 Melanocytic nevi of scalp and neck: Secondary | ICD-10-CM | POA: Diagnosis not present

## 2020-05-22 DIAGNOSIS — D2222 Melanocytic nevi of left ear and external auricular canal: Secondary | ICD-10-CM | POA: Diagnosis not present

## 2020-05-22 DIAGNOSIS — R208 Other disturbances of skin sensation: Secondary | ICD-10-CM | POA: Diagnosis not present

## 2020-05-22 DIAGNOSIS — D2261 Melanocytic nevi of right upper limb, including shoulder: Secondary | ICD-10-CM | POA: Diagnosis not present

## 2020-05-22 DIAGNOSIS — D2271 Melanocytic nevi of right lower limb, including hip: Secondary | ICD-10-CM | POA: Diagnosis not present

## 2020-05-22 DIAGNOSIS — L821 Other seborrheic keratosis: Secondary | ICD-10-CM | POA: Diagnosis not present

## 2020-05-22 DIAGNOSIS — D2272 Melanocytic nevi of left lower limb, including hip: Secondary | ICD-10-CM | POA: Diagnosis not present

## 2020-06-01 DIAGNOSIS — H1045 Other chronic allergic conjunctivitis: Secondary | ICD-10-CM | POA: Diagnosis not present

## 2020-06-01 DIAGNOSIS — H2513 Age-related nuclear cataract, bilateral: Secondary | ICD-10-CM | POA: Diagnosis not present

## 2020-06-01 DIAGNOSIS — E113293 Type 2 diabetes mellitus with mild nonproliferative diabetic retinopathy without macular edema, bilateral: Secondary | ICD-10-CM | POA: Diagnosis not present

## 2020-06-02 DIAGNOSIS — M1A00X0 Idiopathic chronic gout, unspecified site, without tophus (tophi): Secondary | ICD-10-CM | POA: Diagnosis not present

## 2020-06-02 DIAGNOSIS — M7021 Olecranon bursitis, right elbow: Secondary | ICD-10-CM | POA: Diagnosis not present

## 2020-06-12 DIAGNOSIS — Z79899 Other long term (current) drug therapy: Secondary | ICD-10-CM | POA: Diagnosis not present

## 2020-06-12 DIAGNOSIS — E1142 Type 2 diabetes mellitus with diabetic polyneuropathy: Secondary | ICD-10-CM | POA: Diagnosis not present

## 2020-06-19 DIAGNOSIS — M1A00X0 Idiopathic chronic gout, unspecified site, without tophus (tophi): Secondary | ICD-10-CM | POA: Diagnosis not present

## 2020-06-19 DIAGNOSIS — E785 Hyperlipidemia, unspecified: Secondary | ICD-10-CM | POA: Diagnosis not present

## 2020-06-19 DIAGNOSIS — M5136 Other intervertebral disc degeneration, lumbar region: Secondary | ICD-10-CM | POA: Diagnosis not present

## 2020-06-19 DIAGNOSIS — N183 Chronic kidney disease, stage 3 unspecified: Secondary | ICD-10-CM | POA: Diagnosis not present

## 2020-06-19 DIAGNOSIS — E1142 Type 2 diabetes mellitus with diabetic polyneuropathy: Secondary | ICD-10-CM | POA: Diagnosis not present

## 2020-06-19 DIAGNOSIS — Z125 Encounter for screening for malignant neoplasm of prostate: Secondary | ICD-10-CM | POA: Diagnosis not present

## 2020-06-19 DIAGNOSIS — E1122 Type 2 diabetes mellitus with diabetic chronic kidney disease: Secondary | ICD-10-CM | POA: Diagnosis not present

## 2020-06-19 DIAGNOSIS — I10 Essential (primary) hypertension: Secondary | ICD-10-CM | POA: Diagnosis not present

## 2020-09-25 ENCOUNTER — Other Ambulatory Visit: Payer: Self-pay

## 2020-09-25 DIAGNOSIS — E1142 Type 2 diabetes mellitus with diabetic polyneuropathy: Secondary | ICD-10-CM

## 2020-10-01 ENCOUNTER — Other Ambulatory Visit
Admission: RE | Admit: 2020-10-01 | Discharge: 2020-10-01 | Disposition: A | Discharge status: Home / Self Care | Payer: PPO | Source: Ambulatory Visit | Attending: Rheumatology | Admitting: Rheumatology

## 2020-10-01 DIAGNOSIS — M1A00X0 Idiopathic chronic gout, unspecified site, without tophus (tophi): Secondary | ICD-10-CM | POA: Diagnosis not present

## 2020-10-01 DIAGNOSIS — M25562 Pain in left knee: Secondary | ICD-10-CM | POA: Diagnosis not present

## 2020-10-01 DIAGNOSIS — M7021 Olecranon bursitis, right elbow: Secondary | ICD-10-CM | POA: Diagnosis not present

## 2020-10-01 LAB — SYNOVIAL CELL COUNT + DIFF, W/ CRYSTALS
Eosinophils-Synovial: 0 %
Lymphocytes-Synovial Fld: 13 %
Monocyte-Macrophage-Synovial Fluid: 77 %
Neutrophil, Synovial: 10 %
WBC, Synovial: 407 /mm3 — ABNORMAL HIGH (ref 0–200)

## 2020-11-02 DIAGNOSIS — J069 Acute upper respiratory infection, unspecified: Secondary | ICD-10-CM | POA: Diagnosis not present

## 2020-11-03 DIAGNOSIS — M1A00X0 Idiopathic chronic gout, unspecified site, without tophus (tophi): Secondary | ICD-10-CM | POA: Diagnosis not present

## 2020-11-23 DIAGNOSIS — E1142 Type 2 diabetes mellitus with diabetic polyneuropathy: Secondary | ICD-10-CM | POA: Diagnosis not present

## 2020-11-23 DIAGNOSIS — E785 Hyperlipidemia, unspecified: Secondary | ICD-10-CM | POA: Diagnosis not present

## 2020-11-23 DIAGNOSIS — M1A00X0 Idiopathic chronic gout, unspecified site, without tophus (tophi): Secondary | ICD-10-CM | POA: Diagnosis not present

## 2020-11-23 DIAGNOSIS — N183 Chronic kidney disease, stage 3 unspecified: Secondary | ICD-10-CM | POA: Diagnosis not present

## 2020-11-23 DIAGNOSIS — M5136 Other intervertebral disc degeneration, lumbar region: Secondary | ICD-10-CM | POA: Diagnosis not present

## 2020-11-23 DIAGNOSIS — E1122 Type 2 diabetes mellitus with diabetic chronic kidney disease: Secondary | ICD-10-CM | POA: Diagnosis not present

## 2020-11-24 DIAGNOSIS — E1122 Type 2 diabetes mellitus with diabetic chronic kidney disease: Secondary | ICD-10-CM | POA: Diagnosis not present

## 2020-11-24 DIAGNOSIS — M1A00X0 Idiopathic chronic gout, unspecified site, without tophus (tophi): Secondary | ICD-10-CM | POA: Diagnosis not present

## 2020-11-24 DIAGNOSIS — M5136 Other intervertebral disc degeneration, lumbar region: Secondary | ICD-10-CM | POA: Diagnosis not present

## 2020-11-24 DIAGNOSIS — N183 Chronic kidney disease, stage 3 unspecified: Secondary | ICD-10-CM | POA: Diagnosis not present

## 2020-11-24 DIAGNOSIS — E785 Hyperlipidemia, unspecified: Secondary | ICD-10-CM | POA: Diagnosis not present

## 2020-11-24 DIAGNOSIS — E1142 Type 2 diabetes mellitus with diabetic polyneuropathy: Secondary | ICD-10-CM | POA: Diagnosis not present

## 2020-11-30 DIAGNOSIS — Z0000 Encounter for general adult medical examination without abnormal findings: Secondary | ICD-10-CM | POA: Diagnosis not present

## 2020-11-30 DIAGNOSIS — N183 Chronic kidney disease, stage 3 unspecified: Secondary | ICD-10-CM | POA: Diagnosis not present

## 2020-11-30 DIAGNOSIS — I10 Essential (primary) hypertension: Secondary | ICD-10-CM | POA: Diagnosis not present

## 2020-11-30 DIAGNOSIS — E1122 Type 2 diabetes mellitus with diabetic chronic kidney disease: Secondary | ICD-10-CM | POA: Diagnosis not present

## 2020-11-30 DIAGNOSIS — M1A00X0 Idiopathic chronic gout, unspecified site, without tophus (tophi): Secondary | ICD-10-CM | POA: Diagnosis not present

## 2020-11-30 DIAGNOSIS — Z87891 Personal history of nicotine dependence: Secondary | ICD-10-CM | POA: Diagnosis not present

## 2020-11-30 DIAGNOSIS — E785 Hyperlipidemia, unspecified: Secondary | ICD-10-CM | POA: Diagnosis not present

## 2020-11-30 DIAGNOSIS — Z1211 Encounter for screening for malignant neoplasm of colon: Secondary | ICD-10-CM | POA: Diagnosis not present

## 2020-11-30 DIAGNOSIS — Z136 Encounter for screening for cardiovascular disorders: Secondary | ICD-10-CM | POA: Diagnosis not present

## 2020-11-30 DIAGNOSIS — M5136 Other intervertebral disc degeneration, lumbar region: Secondary | ICD-10-CM | POA: Diagnosis not present

## 2020-11-30 DIAGNOSIS — E1142 Type 2 diabetes mellitus with diabetic polyneuropathy: Secondary | ICD-10-CM | POA: Diagnosis not present

## 2020-12-01 ENCOUNTER — Other Ambulatory Visit: Payer: Self-pay | Admitting: Internal Medicine

## 2020-12-01 DIAGNOSIS — E785 Hyperlipidemia, unspecified: Secondary | ICD-10-CM

## 2020-12-01 DIAGNOSIS — Z136 Encounter for screening for cardiovascular disorders: Secondary | ICD-10-CM

## 2020-12-01 DIAGNOSIS — E1122 Type 2 diabetes mellitus with diabetic chronic kidney disease: Secondary | ICD-10-CM

## 2020-12-01 DIAGNOSIS — Z87891 Personal history of nicotine dependence: Secondary | ICD-10-CM

## 2020-12-11 ENCOUNTER — Other Ambulatory Visit: Payer: Self-pay | Admitting: Family Medicine

## 2020-12-11 NOTE — Telephone Encounter (Signed)
Requested medications are due for refill today yes  Requested medications are on the active medication list yes  Last refill 12/1  Last visit 08/2019  Future visit scheduled no  Notes to clinic Failed protocol due to no valid visit within 6  months.

## 2020-12-14 ENCOUNTER — Other Ambulatory Visit: Payer: Self-pay | Admitting: Family Medicine

## 2020-12-14 NOTE — Telephone Encounter (Signed)
Routing to provider  

## 2020-12-14 NOTE — Telephone Encounter (Signed)
This request has already been addressed  By other today. Patient no longer under pre scriber's care-refusing request.

## 2020-12-15 ENCOUNTER — Ambulatory Visit: Payer: PPO

## 2020-12-17 DIAGNOSIS — J069 Acute upper respiratory infection, unspecified: Secondary | ICD-10-CM | POA: Diagnosis not present

## 2020-12-17 DIAGNOSIS — U071 COVID-19: Secondary | ICD-10-CM | POA: Diagnosis not present

## 2020-12-21 ENCOUNTER — Ambulatory Visit: Payer: HMO

## 2020-12-22 DIAGNOSIS — U071 COVID-19: Secondary | ICD-10-CM | POA: Diagnosis not present

## 2021-01-01 ENCOUNTER — Ambulatory Visit: Payer: HMO

## 2021-01-08 ENCOUNTER — Ambulatory Visit: Payer: HMO

## 2021-02-05 ENCOUNTER — Ambulatory Visit
Admission: RE | Admit: 2021-02-05 | Discharge: 2021-02-05 | Disposition: A | Discharge status: Home / Self Care | Payer: HMO | Source: Ambulatory Visit | Attending: Internal Medicine | Admitting: Internal Medicine

## 2021-02-05 ENCOUNTER — Other Ambulatory Visit: Payer: Self-pay

## 2021-02-05 DIAGNOSIS — E785 Hyperlipidemia, unspecified: Secondary | ICD-10-CM | POA: Diagnosis not present

## 2021-02-05 DIAGNOSIS — Z136 Encounter for screening for cardiovascular disorders: Secondary | ICD-10-CM

## 2021-02-05 DIAGNOSIS — Z87891 Personal history of nicotine dependence: Secondary | ICD-10-CM | POA: Insufficient documentation

## 2021-02-05 DIAGNOSIS — E1122 Type 2 diabetes mellitus with diabetic chronic kidney disease: Secondary | ICD-10-CM

## 2021-02-22 DIAGNOSIS — E1122 Type 2 diabetes mellitus with diabetic chronic kidney disease: Secondary | ICD-10-CM | POA: Diagnosis not present

## 2021-02-22 DIAGNOSIS — E785 Hyperlipidemia, unspecified: Secondary | ICD-10-CM | POA: Diagnosis not present

## 2021-02-22 DIAGNOSIS — N183 Chronic kidney disease, stage 3 unspecified: Secondary | ICD-10-CM | POA: Diagnosis not present

## 2021-02-22 DIAGNOSIS — E1142 Type 2 diabetes mellitus with diabetic polyneuropathy: Secondary | ICD-10-CM | POA: Diagnosis not present

## 2021-03-01 DIAGNOSIS — N183 Chronic kidney disease, stage 3 unspecified: Secondary | ICD-10-CM | POA: Diagnosis not present

## 2021-03-01 DIAGNOSIS — I10 Essential (primary) hypertension: Secondary | ICD-10-CM | POA: Diagnosis not present

## 2021-03-01 DIAGNOSIS — Z79891 Long term (current) use of opiate analgesic: Secondary | ICD-10-CM | POA: Diagnosis not present

## 2021-03-01 DIAGNOSIS — E785 Hyperlipidemia, unspecified: Secondary | ICD-10-CM | POA: Diagnosis not present

## 2021-03-01 DIAGNOSIS — M5136 Other intervertebral disc degeneration, lumbar region: Secondary | ICD-10-CM | POA: Diagnosis not present

## 2021-03-01 DIAGNOSIS — E1142 Type 2 diabetes mellitus with diabetic polyneuropathy: Secondary | ICD-10-CM | POA: Diagnosis not present

## 2021-03-01 DIAGNOSIS — I77811 Abdominal aortic ectasia: Secondary | ICD-10-CM | POA: Diagnosis not present

## 2021-03-01 DIAGNOSIS — M1A00X0 Idiopathic chronic gout, unspecified site, without tophus (tophi): Secondary | ICD-10-CM | POA: Diagnosis not present

## 2021-03-01 DIAGNOSIS — E1122 Type 2 diabetes mellitus with diabetic chronic kidney disease: Secondary | ICD-10-CM | POA: Diagnosis not present

## 2021-05-25 DIAGNOSIS — Z79891 Long term (current) use of opiate analgesic: Secondary | ICD-10-CM | POA: Diagnosis not present

## 2021-05-25 DIAGNOSIS — I10 Essential (primary) hypertension: Secondary | ICD-10-CM | POA: Diagnosis not present

## 2021-05-25 DIAGNOSIS — E1122 Type 2 diabetes mellitus with diabetic chronic kidney disease: Secondary | ICD-10-CM | POA: Diagnosis not present

## 2021-05-25 DIAGNOSIS — E785 Hyperlipidemia, unspecified: Secondary | ICD-10-CM | POA: Diagnosis not present

## 2021-05-25 DIAGNOSIS — E1142 Type 2 diabetes mellitus with diabetic polyneuropathy: Secondary | ICD-10-CM | POA: Diagnosis not present

## 2021-05-25 DIAGNOSIS — N183 Chronic kidney disease, stage 3 unspecified: Secondary | ICD-10-CM | POA: Diagnosis not present

## 2021-05-25 DIAGNOSIS — M1A00X0 Idiopathic chronic gout, unspecified site, without tophus (tophi): Secondary | ICD-10-CM | POA: Diagnosis not present

## 2021-05-25 DIAGNOSIS — M5136 Other intervertebral disc degeneration, lumbar region: Secondary | ICD-10-CM | POA: Diagnosis not present

## 2021-06-01 DIAGNOSIS — E1142 Type 2 diabetes mellitus with diabetic polyneuropathy: Secondary | ICD-10-CM | POA: Diagnosis not present

## 2021-06-01 DIAGNOSIS — M1A00X0 Idiopathic chronic gout, unspecified site, without tophus (tophi): Secondary | ICD-10-CM | POA: Diagnosis not present

## 2021-06-01 DIAGNOSIS — I10 Essential (primary) hypertension: Secondary | ICD-10-CM | POA: Diagnosis not present

## 2021-06-01 DIAGNOSIS — M1990 Unspecified osteoarthritis, unspecified site: Secondary | ICD-10-CM | POA: Diagnosis not present

## 2021-06-01 DIAGNOSIS — M5136 Other intervertebral disc degeneration, lumbar region: Secondary | ICD-10-CM | POA: Diagnosis not present

## 2021-06-01 DIAGNOSIS — E785 Hyperlipidemia, unspecified: Secondary | ICD-10-CM | POA: Diagnosis not present

## 2021-06-01 DIAGNOSIS — E1122 Type 2 diabetes mellitus with diabetic chronic kidney disease: Secondary | ICD-10-CM | POA: Diagnosis not present

## 2021-06-01 DIAGNOSIS — I77811 Abdominal aortic ectasia: Secondary | ICD-10-CM | POA: Diagnosis not present

## 2021-06-01 DIAGNOSIS — N183 Chronic kidney disease, stage 3 unspecified: Secondary | ICD-10-CM | POA: Diagnosis not present

## 2021-07-08 ENCOUNTER — Encounter: Payer: Self-pay | Admitting: *Deleted

## 2021-07-09 ENCOUNTER — Encounter
Admission: RE | Disposition: A | Discharge status: Home / Self Care | Payer: Self-pay | Source: Home / Self Care | Attending: Gastroenterology

## 2021-07-09 ENCOUNTER — Encounter: Payer: Self-pay | Admitting: *Deleted

## 2021-07-09 ENCOUNTER — Ambulatory Visit
Admission: RE | Admit: 2021-07-09 | Discharge: 2021-07-09 | Disposition: A | Discharge status: Home / Self Care | Payer: HMO | Attending: Gastroenterology | Admitting: Gastroenterology

## 2021-07-09 ENCOUNTER — Ambulatory Visit: Payer: HMO | Admitting: Anesthesiology

## 2021-07-09 DIAGNOSIS — E1122 Type 2 diabetes mellitus with diabetic chronic kidney disease: Secondary | ICD-10-CM | POA: Insufficient documentation

## 2021-07-09 DIAGNOSIS — E785 Hyperlipidemia, unspecified: Secondary | ICD-10-CM | POA: Diagnosis not present

## 2021-07-09 DIAGNOSIS — Z1211 Encounter for screening for malignant neoplasm of colon: Secondary | ICD-10-CM | POA: Diagnosis not present

## 2021-07-09 DIAGNOSIS — D122 Benign neoplasm of ascending colon: Secondary | ICD-10-CM | POA: Diagnosis not present

## 2021-07-09 DIAGNOSIS — K5730 Diverticulosis of large intestine without perforation or abscess without bleeding: Secondary | ICD-10-CM | POA: Insufficient documentation

## 2021-07-09 DIAGNOSIS — I129 Hypertensive chronic kidney disease with stage 1 through stage 4 chronic kidney disease, or unspecified chronic kidney disease: Secondary | ICD-10-CM | POA: Diagnosis not present

## 2021-07-09 DIAGNOSIS — K5790 Diverticulosis of intestine, part unspecified, without perforation or abscess without bleeding: Secondary | ICD-10-CM | POA: Diagnosis not present

## 2021-07-09 DIAGNOSIS — Z6835 Body mass index (BMI) 35.0-35.9, adult: Secondary | ICD-10-CM | POA: Diagnosis not present

## 2021-07-09 DIAGNOSIS — Z794 Long term (current) use of insulin: Secondary | ICD-10-CM | POA: Insufficient documentation

## 2021-07-09 DIAGNOSIS — Z86010 Personal history of colonic polyps: Secondary | ICD-10-CM | POA: Diagnosis not present

## 2021-07-09 DIAGNOSIS — M109 Gout, unspecified: Secondary | ICD-10-CM | POA: Diagnosis not present

## 2021-07-09 DIAGNOSIS — E669 Obesity, unspecified: Secondary | ICD-10-CM | POA: Diagnosis not present

## 2021-07-09 DIAGNOSIS — Z888 Allergy status to other drugs, medicaments and biological substances status: Secondary | ICD-10-CM | POA: Diagnosis not present

## 2021-07-09 DIAGNOSIS — N189 Chronic kidney disease, unspecified: Secondary | ICD-10-CM | POA: Diagnosis not present

## 2021-07-09 DIAGNOSIS — K635 Polyp of colon: Secondary | ICD-10-CM | POA: Insufficient documentation

## 2021-07-09 DIAGNOSIS — D123 Benign neoplasm of transverse colon: Secondary | ICD-10-CM | POA: Diagnosis not present

## 2021-07-09 DIAGNOSIS — Z79899 Other long term (current) drug therapy: Secondary | ICD-10-CM | POA: Insufficient documentation

## 2021-07-09 HISTORY — DX: Unilateral primary osteoarthritis, unspecified knee: M17.10

## 2021-07-09 HISTORY — DX: Idiopathic chronic gout, unspecified site, without tophus (tophi): M1A.00X0

## 2021-07-09 HISTORY — PX: COLONOSCOPY WITH PROPOFOL: SHX5780

## 2021-07-09 HISTORY — DX: Other intervertebral disc degeneration, lumbar region without mention of lumbar back pain or lower extremity pain: M51.369

## 2021-07-09 HISTORY — DX: Benign prostatic hyperplasia without lower urinary tract symptoms: N40.0

## 2021-07-09 HISTORY — DX: Other intervertebral disc degeneration, lumbar region: M51.36

## 2021-07-09 HISTORY — DX: Chronic gout, unspecified, without tophus (tophi): M1A.9XX0

## 2021-07-09 HISTORY — DX: Abdominal aortic ectasia: I77.811

## 2021-07-09 LAB — GLUCOSE, CAPILLARY: Glucose-Capillary: 185 mg/dL — ABNORMAL HIGH (ref 70–99)

## 2021-07-09 SURGERY — COLONOSCOPY WITH PROPOFOL
Anesthesia: General

## 2021-07-09 MED ORDER — LIDOCAINE HCL (PF) 2 % IJ SOLN
INTRAMUSCULAR | Status: AC
  Filled 2021-07-09: qty 5

## 2021-07-09 MED ORDER — PROPOFOL 500 MG/50ML IV EMUL
INTRAVENOUS | Status: DC | PRN
  Administered 2021-07-09: 100 ug/kg/min via INTRAVENOUS

## 2021-07-09 MED ORDER — PROPOFOL 500 MG/50ML IV EMUL
INTRAVENOUS | Status: AC
  Filled 2021-07-09: qty 150

## 2021-07-09 MED ORDER — GLYCOPYRROLATE 0.2 MG/ML IJ SOLN
INTRAMUSCULAR | Status: AC
  Filled 2021-07-09: qty 1

## 2021-07-09 MED ORDER — SODIUM CHLORIDE 0.9 % IV SOLN
INTRAVENOUS | Status: DC
  Administered 2021-07-09: 20 mL/h via INTRAVENOUS

## 2021-07-09 MED ORDER — PROPOFOL 10 MG/ML IV BOLUS
INTRAVENOUS | Status: DC | PRN
  Administered 2021-07-09: 100 mg via INTRAVENOUS

## 2021-07-09 MED ORDER — PHENYLEPHRINE HCL (PRESSORS) 10 MG/ML IV SOLN
INTRAVENOUS | Status: AC
  Filled 2021-07-09: qty 1

## 2021-07-09 MED ORDER — LIDOCAINE HCL (CARDIAC) PF 100 MG/5ML IV SOSY
PREFILLED_SYRINGE | INTRAVENOUS | Status: DC | PRN
  Administered 2021-07-09: 50 mg via INTRAVENOUS

## 2021-07-09 NOTE — Interval H&P Note (Signed)
History and Physical Interval Note:  07/09/2021 7:47 AM  Jerry Pollard  has presented today for surgery, with the diagnosis of PERSONAL HX.OF COLON POLYPS.  The various methods of treatment have been discussed with the patient and family. After consideration of risks, benefits and other options for treatment, the patient has consented to  Procedure(s) with comments: COLONOSCOPY WITH PROPOFOL (N/A) - IDDM as a surgical intervention.  The patient's history has been reviewed, patient examined, no change in status, stable for surgery.  I have reviewed the patient's chart and labs.  Questions were answered to the patient's satisfaction.     Lesly Rubenstein  Ok to proceed with colonoscopy

## 2021-07-09 NOTE — Anesthesia Postprocedure Evaluation (Signed)
Anesthesia Post Note  Patient: Jerry Pollard  Procedure(s) Performed: COLONOSCOPY WITH PROPOFOL  Patient location during evaluation: PACU Anesthesia Type: General Level of consciousness: awake and alert Pain management: pain level controlled Vital Signs Assessment: post-procedure vital signs reviewed and stable Respiratory status: spontaneous breathing Cardiovascular status: blood pressure returned to baseline Postop Assessment: no headache Anesthetic complications: no   No notable events documented.   Last Vitals:  Vitals:   07/09/21 0820 07/09/21 0850  BP: 99/68 105/70  Pulse: 60 60  Resp: 15 16  Temp:    SpO2: 97% 97%    Last Pain:  Vitals:   07/09/21 0810  TempSrc: Temporal  PainSc:                  Milinda Pointer

## 2021-07-09 NOTE — Anesthesia Preprocedure Evaluation (Signed)
Anesthesia Evaluation  Patient identified by MRN, date of birth, ID band Patient awake    Reviewed: Allergy & Precautions, H&P , NPO status , Patient's Chart, lab work & pertinent test results  History of Anesthesia Complications Negative for: history of anesthetic complications  Airway Mallampati: III  TM Distance: <3 FB Neck ROM: limited    Dental  (+) Chipped   Pulmonary neg shortness of breath, former smoker,    Pulmonary exam normal - rhonchi (-) wheezing      Cardiovascular Exercise Tolerance: Good hypertension, (-) angina(-) Past MI and (-) DOE Normal cardiovascular exam Rhythm:Regular Rate:Normal - Systolic murmurs and - Diastolic murmurs    Neuro/Psych PSYCHIATRIC DISORDERS  Neuromuscular disease    GI/Hepatic Neg liver ROS, GERD  Medicated and Controlled,  Endo/Other  diabetes, Type 2  Renal/GU Renal disease  negative genitourinary   Musculoskeletal   Abdominal Normal abdominal exam  (+)   Peds  Hematology negative hematology ROS (+)   Anesthesia Other Findings Past Medical History: No date: Arthritis No date: Chronic kidney disease No date: Depression No date: Diabetes mellitus without complication (HCC) No date: GERD (gastroesophageal reflux disease) No date: Gout No date: Hyperlipidemia No date: Hypertension  Past Surgical History: No date: APPENDECTOMY No date: BACK SURGERY No date: CARPAL TUNNEL RELEASE; Right No date: CHOLECYSTECTOMY No date: COLONOSCOPY No date: SPINE SURGERY  BMI    Body Mass Index:  33.91 kg/m      Reproductive/Obstetrics negative OB ROS                             Anesthesia Physical  Anesthesia Plan  ASA: III  Anesthesia Plan: General   Post-op Pain Management:    Induction: Intravenous  PONV Risk Score and Plan: Propofol infusion and TIVA  Airway Management Planned: Natural Airway and Nasal Cannula  Additional Equipment:    Intra-op Plan:   Post-operative Plan:   Informed Consent: I have reviewed the patients History and Physical, chart, labs and discussed the procedure including the risks, benefits and alternatives for the proposed anesthesia with the patient or authorized representative who has indicated his/her understanding and acceptance.     Dental Advisory Given  Plan Discussed with: Anesthesiologist, CRNA and Surgeon  Anesthesia Plan Comments: (IVGA, PIv x 1, ASA SM, RBO discussed, Consent signed.Marland Kitchen)        Anesthesia Quick Evaluation

## 2021-07-09 NOTE — Transfer of Care (Signed)
Immediate Anesthesia Transfer of Care Note  Patient: Jerry Pollard  Procedure(s) Performed: COLONOSCOPY WITH PROPOFOL  Patient Location: PACU  Anesthesia Type:General  Level of Consciousness: drowsy  Airway & Oxygen Therapy: Patient Spontanous Breathing and Patient connected to face mask  Post-op Assessment: Report given to RN and Post -op Vital signs reviewed and stable  Post vital signs: Reviewed and stable  Last Vitals:  Vitals Value Taken Time  BP    Temp    Pulse    Resp    SpO2      Last Pain:  Vitals:   07/09/21 0810  TempSrc: Temporal  PainSc:          Complications: No notable events documented.

## 2021-07-09 NOTE — Op Note (Signed)
[facility] Patient Name: Jerry [facility] Procedure Date: 07/09/2021 7:48 AM MRN: [id number] Account #: [id number] Date of Birth: 06/17/1951 Admit Type: Outpatient Age: 70 Room: [facility] ENDO ROOM 3 Gender: Male Note Status: Finalized Procedure:             Colonoscopy Indications:           Surveillance: Personal history of adenomatous polyps                         on last colonoscopy 3 years ago, High risk colon                         cancer surveillance: Personal history of multiple (3                         or more) adenomas Providers:             Andrey Farmer MD, MD Medicines:             Monitored Anesthesia Care Complications:         No immediate complications. Estimated blood loss:                         Minimal. Procedure:             Pre-Anesthesia Assessment:                        - Prior to the procedure, a History and Physical was                         performed, and patient medications and allergies were                         reviewed. The patient is competent. The risks and                         benefits of the procedure and the sedation options and                         risks were discussed with the patient. All questions                         were answered and informed consent was obtained.                         Patient identification and proposed procedure were                         verified by the physician, the nurse, the anesthetist                         and the technician in the endoscopy suite. Mental                         Status Examination: alert and oriented. Airway                         Examination: normal oropharyngeal airway and neck  mobility. Respiratory Examination: clear to                         auscultation. CV Examination: normal. Prophylactic                         Antibiotics: The patient does not require prophylactic                         antibiotics. Prior  Anticoagulants: The patient has                         taken no previous anticoagulant or antiplatelet                         agents. ASA Grade Assessment: II - A patient with mild                         systemic disease. After reviewing the risks and                         benefits, the patient was deemed in satisfactory                         condition to undergo the procedure. The anesthesia                         plan was to use monitored anesthesia care (MAC).                         Immediately prior to administration of medications,                         the patient was re-assessed for adequacy to receive                         sedatives. The heart rate, respiratory rate, oxygen                         saturations, blood pressure, adequacy of pulmonary                         ventilation, and response to care were monitored                         throughout the procedure. The physical status of the                         patient was re-assessed after the procedure.                        After obtaining informed consent, the colonoscope was                         passed under direct vision. Throughout the procedure,                         the patient's blood pressure, pulse, and oxygen  saturations were monitored continuously. The                         Colonoscope was introduced through the anus and                         advanced to the the cecum, identified by appendiceal                         orifice and ileocecal valve. The colonoscopy was                         performed without difficulty. The patient tolerated                         the procedure well. The quality of the bowel                         preparation was good. Findings:      The perianal and digital rectal examinations were normal.      A 2 mm polyp was found in the ascending colon. The polyp was sessile.       The polyp was removed with a jumbo cold forceps. Resection  and retrieval       were complete. Estimated blood loss was minimal.      A 3 mm polyp was found in the transverse colon. The polyp was sessile.       The polyp was removed with a cold snare. Resection and retrieval were       complete. Estimated blood loss was minimal.      A 1 mm polyp was found in the recto-sigmoid colon. The polyp was       sessile. The polyp was removed with a jumbo cold forceps. Resection and       retrieval were complete. Estimated blood loss was minimal.      A few small and large-mouthed diverticula were found in the sigmoid       colon.      The exam was otherwise without abnormality on direct and retroflexion       views. Impression:            - One 2 mm polyp in the ascending colon, removed with                         a jumbo cold forceps. Resected and retrieved.                        - One 3 mm polyp in the transverse colon, removed with                         a cold snare. Resected and retrieved.                        - One 1 mm polyp at the recto-sigmoid colon, removed                         with a jumbo cold forceps. Resected and retrieved.                        -  Diverticulosis in the sigmoid colon.                        - The examination was otherwise normal on direct and                         retroflexion views. Recommendation:        - Discharge patient to home.                        - Resume previous diet.                        - Continue present medications.                        - Await pathology results.                        - Repeat colonoscopy in 5 years for surveillance.                        - Return to referring physician as previously                         scheduled. Procedure Code(s):     --- Professional ---                        [phone], Colonoscopy, flexible; with removal of                         tumor(s), polyp(s), or other lesion(s) by snare                         technique                        45380, 67,  Colonoscopy, flexible; with biopsy, single                         or multiple Diagnosis Code(s):     --- Professional ---                        K63.5, Polyp of colon                        Z86.010, Personal history of colonic polyps                        K57.30, Diverticulosis of large intestine without                         perforation or abscess without bleeding CPT copyright 2019 [employer]. All rights reserved. The codes documented in this report are preliminary and upon coder review may  be revised to meet current compliance requirements. Andrey Farmer MD, MD 07/09/2021 8:15:18 AM Number of Addenda: 0 Note Initiated On: 07/09/2021 7:48 AM Scope Withdrawal Time: 0 hours 11 minutes 58 seconds  Total Procedure Duration: 0 hours 15 minutes 23 seconds  Estimated Blood Loss:  Estimated blood loss was minimal.      [place]  [facility]

## 2021-07-09 NOTE — H&P (Signed)
Outpatient short stay form Pre-procedure 07/09/2021 7:44 AM Raylene Miyamoto MD, MPH  Primary Physician: Dr. Caryl Comes  Reason for visit:  Surveillance colonoscopy  History of present illness:   70 y/o gentleman with history of hypertension, obesity, and gout here for surveillance colonoscopy for > 3 tubular adenomas on colonoscopy done 3 years ago. History of appendectomy and cholecystectomy. No blood thinners. No family history of GI malignancies.    Current Facility-Administered Medications:    0.9 %  sodium chloride infusion, , Intravenous, Continuous, Kariem Wolfson, Hilton Cork, MD, Last Rate: 20 mL/hr at 07/09/21 [id number], Continued from Pre-op at 07/09/21 0721  Medications Prior to Admission  Medication Sig Dispense Refill Last Dose   allopurinol (ZYLOPRIM) 100 MG tablet Take 1 tablet (100 mg total) by mouth daily. 90 tablet 4 07/08/2021   amLODipine (NORVASC) 10 MG tablet Take 1 tablet (10 mg total) by mouth daily. 90 tablet 4 07/09/2021 at 0500   atorvastatin (LIPITOR) 10 MG tablet Take 1 tablet (10 mg total) by mouth daily. 90 tablet 4 07/08/2021   azelastine (ASTELIN) 0.1 % nasal spray Place 1 spray into both nostrils 2 (two) times daily. Use in each nostril as directed   07/08/2021   benazepril (LOTENSIN) 40 MG tablet Take 1 tablet (40 mg total) by mouth daily. 90 tablet 4 07/09/2021 at 0500   colchicine 0.6 MG tablet Take 1 tablet (0.6 mg total) by mouth daily. 30 tablet 3 07/08/2021   diclofenac sodium (VOLTAREN) 1 % GEL Apply 2 g topically 4 (four) times daily. 300 g 6 07/08/2021   fluticasone (FLONASE) 50 MCG/ACT nasal spray Place 2 sprays into both nostrils daily. 16 g 11 Past Week   FLUZONE HIGH-DOSE QUADRIVALENT 0.7 ML SUSY ADM 0.7ML IM UTD   Past Week   gabapentin (NEURONTIN) 300 MG capsule Take 1 capsule (300 mg total) by mouth 2 (two) times daily. 180 capsule 4 Past Week   glucose blood (ONE TOUCH ULTRA TEST) test strip 100 each by Other route See admin instructions. Use as instructed 100  each 12 07/08/2021   insulin aspart (NOVOLOG FLEXPEN) 100 UNIT/ML FlexPen INJECT 18 UNITS UNDER THE SKIN 2 TIMES A DAY Sliding scale 5 pen 11 Past Week   insulin NPH Human (NOVOLIN N) 100 UNIT/ML injection Inject into the skin. Injection 24 units in the morning and 24 units at night   Past Week   Lancets (ONETOUCH ULTRASOFT) lancets Use as instructed 100 each 12 Past Week   metFORMIN (GLUCOPHAGE-XR) 500 MG 24 hr tablet Take 1 tablet (500 mg total) by mouth 3 (three) times daily. 270 tablet 4 Past Week   pantoprazole (PROTONIX) 40 MG tablet Take 1 tablet (40 mg total) by mouth daily. 90 tablet 4 07/08/2021   traMADol (ULTRAM) 50 MG tablet Take 1 tablet (50 mg total) by mouth daily as needed. 30 tablet 1 Past Week   fexofenadine (ALLEGRA) 180 MG tablet Take 180 mg by mouth daily.        Allergies  Allergen Reactions   Crestor [Rosuvastatin] Other (See Comments)    Myalgias    Jardiance [Empagliflozin]      Past Medical History:  Diagnosis Date   Aortic ectasia, abdominal (HCC)    Arthritis    BPH (benign prostatic hyperplasia)    Chronic gouty arthritis    Chronic kidney disease    DDD (degenerative disc disease), lumbar    Depression    Diabetes mellitus without complication (HCC)    GERD (gastroesophageal reflux disease)  Gout    Hyperlipidemia    Hypertension    Primary osteoarthritis of knee    left   Primary osteoarthritis of knee    right    Review of systems:  Otherwise negative.    Physical Exam  Gen: Alert, oriented. Appears stated age.  HEENT: PERRLA. Lungs: No respiratory distress CV: RRR Abd: soft, benign, no masses Ext: No edema    Planned procedures: Proceed with colonoscopy. The patient understands the nature of the planned procedure, indications, risks, alternatives and potential complications including but not limited to bleeding, infection, perforation, damage to internal organs and possible oversedation/side effects from anesthesia. The patient  agrees and gives consent to proceed.  Please refer to procedure notes for findings, recommendations and patient disposition/instructions.     Raylene Miyamoto MD, MPH Gastroenterology 07/09/2021  7:44 AM

## 2021-07-12 LAB — SURGICAL PATHOLOGY

## 2021-07-13 DIAGNOSIS — H1045 Other chronic allergic conjunctivitis: Secondary | ICD-10-CM | POA: Diagnosis not present

## 2021-07-13 DIAGNOSIS — E113293 Type 2 diabetes mellitus with mild nonproliferative diabetic retinopathy without macular edema, bilateral: Secondary | ICD-10-CM | POA: Diagnosis not present

## 2021-07-13 DIAGNOSIS — H2513 Age-related nuclear cataract, bilateral: Secondary | ICD-10-CM | POA: Diagnosis not present

## 2021-07-13 DIAGNOSIS — H5203 Hypermetropia, bilateral: Secondary | ICD-10-CM | POA: Diagnosis not present

## 2021-08-24 ENCOUNTER — Other Ambulatory Visit
Admission: RE | Admit: 2021-08-24 | Discharge: 2021-08-24 | Disposition: A | Discharge status: Home / Self Care | Payer: HMO | Source: Ambulatory Visit | Attending: Rheumatology | Admitting: Rheumatology

## 2021-08-24 DIAGNOSIS — M25562 Pain in left knee: Secondary | ICD-10-CM | POA: Diagnosis not present

## 2021-08-24 DIAGNOSIS — M1A00X0 Idiopathic chronic gout, unspecified site, without tophus (tophi): Secondary | ICD-10-CM | POA: Insufficient documentation

## 2021-08-24 LAB — SYNOVIAL CELL COUNT + DIFF, W/ CRYSTALS
Eosinophils-Synovial: 0 %
Lymphocytes-Synovial Fld: 20 %
Monocyte-Macrophage-Synovial Fluid: 77 %
Neutrophil, Synovial: 3 %
WBC, Synovial: 102 /mm3 (ref 0–200)

## 2021-08-25 DIAGNOSIS — E1142 Type 2 diabetes mellitus with diabetic polyneuropathy: Secondary | ICD-10-CM | POA: Diagnosis not present

## 2021-08-25 DIAGNOSIS — E785 Hyperlipidemia, unspecified: Secondary | ICD-10-CM | POA: Diagnosis not present

## 2021-08-25 DIAGNOSIS — M1A00X0 Idiopathic chronic gout, unspecified site, without tophus (tophi): Secondary | ICD-10-CM | POA: Diagnosis not present

## 2021-08-28 DIAGNOSIS — M10062 Idiopathic gout, left knee: Secondary | ICD-10-CM | POA: Diagnosis not present

## 2021-09-01 DIAGNOSIS — M1A00X0 Idiopathic chronic gout, unspecified site, without tophus (tophi): Secondary | ICD-10-CM | POA: Diagnosis not present

## 2021-09-01 DIAGNOSIS — E785 Hyperlipidemia, unspecified: Secondary | ICD-10-CM | POA: Diagnosis not present

## 2021-09-01 DIAGNOSIS — I77811 Abdominal aortic ectasia: Secondary | ICD-10-CM | POA: Diagnosis not present

## 2021-09-01 DIAGNOSIS — M5136 Other intervertebral disc degeneration, lumbar region: Secondary | ICD-10-CM | POA: Diagnosis not present

## 2021-09-01 DIAGNOSIS — E1122 Type 2 diabetes mellitus with diabetic chronic kidney disease: Secondary | ICD-10-CM | POA: Diagnosis not present

## 2021-09-01 DIAGNOSIS — E1142 Type 2 diabetes mellitus with diabetic polyneuropathy: Secondary | ICD-10-CM | POA: Diagnosis not present

## 2021-09-01 DIAGNOSIS — Z0000 Encounter for general adult medical examination without abnormal findings: Secondary | ICD-10-CM | POA: Diagnosis not present

## 2021-09-01 DIAGNOSIS — Z23 Encounter for immunization: Secondary | ICD-10-CM | POA: Diagnosis not present

## 2021-09-01 DIAGNOSIS — I10 Essential (primary) hypertension: Secondary | ICD-10-CM | POA: Diagnosis not present

## 2021-09-01 DIAGNOSIS — Z125 Encounter for screening for malignant neoplasm of prostate: Secondary | ICD-10-CM | POA: Diagnosis not present

## 2021-09-01 DIAGNOSIS — Z79891 Long term (current) use of opiate analgesic: Secondary | ICD-10-CM | POA: Diagnosis not present

## 2021-09-01 DIAGNOSIS — N183 Chronic kidney disease, stage 3 unspecified: Secondary | ICD-10-CM | POA: Diagnosis not present

## 2021-09-27 DIAGNOSIS — M1A00X0 Idiopathic chronic gout, unspecified site, without tophus (tophi): Secondary | ICD-10-CM | POA: Diagnosis not present

## 2021-11-15 DIAGNOSIS — R52 Pain, unspecified: Secondary | ICD-10-CM | POA: Diagnosis not present

## 2021-11-15 DIAGNOSIS — R053 Chronic cough: Secondary | ICD-10-CM | POA: Diagnosis not present

## 2021-12-14 DIAGNOSIS — M1A00X0 Idiopathic chronic gout, unspecified site, without tophus (tophi): Secondary | ICD-10-CM | POA: Diagnosis not present

## 2021-12-14 DIAGNOSIS — M19072 Primary osteoarthritis, left ankle and foot: Secondary | ICD-10-CM | POA: Diagnosis not present

## 2021-12-14 DIAGNOSIS — M25572 Pain in left ankle and joints of left foot: Secondary | ICD-10-CM | POA: Diagnosis not present

## 2021-12-17 DIAGNOSIS — Z79891 Long term (current) use of opiate analgesic: Secondary | ICD-10-CM | POA: Diagnosis not present

## 2021-12-17 DIAGNOSIS — Z125 Encounter for screening for malignant neoplasm of prostate: Secondary | ICD-10-CM | POA: Diagnosis not present

## 2021-12-17 DIAGNOSIS — E785 Hyperlipidemia, unspecified: Secondary | ICD-10-CM | POA: Diagnosis not present

## 2021-12-17 DIAGNOSIS — E1122 Type 2 diabetes mellitus with diabetic chronic kidney disease: Secondary | ICD-10-CM | POA: Diagnosis not present

## 2021-12-17 DIAGNOSIS — E1142 Type 2 diabetes mellitus with diabetic polyneuropathy: Secondary | ICD-10-CM | POA: Diagnosis not present

## 2021-12-17 DIAGNOSIS — M1A00X0 Idiopathic chronic gout, unspecified site, without tophus (tophi): Secondary | ICD-10-CM | POA: Diagnosis not present

## 2021-12-17 DIAGNOSIS — N183 Chronic kidney disease, stage 3 unspecified: Secondary | ICD-10-CM | POA: Diagnosis not present

## 2021-12-20 DIAGNOSIS — E1140 Type 2 diabetes mellitus with diabetic neuropathy, unspecified: Secondary | ICD-10-CM | POA: Diagnosis not present

## 2021-12-20 DIAGNOSIS — Z794 Long term (current) use of insulin: Secondary | ICD-10-CM | POA: Diagnosis not present

## 2021-12-20 DIAGNOSIS — M722 Plantar fascial fibromatosis: Secondary | ICD-10-CM | POA: Diagnosis not present

## 2021-12-20 DIAGNOSIS — M76822 Posterior tibial tendinitis, left leg: Secondary | ICD-10-CM | POA: Diagnosis not present

## 2021-12-31 DIAGNOSIS — I10 Essential (primary) hypertension: Secondary | ICD-10-CM | POA: Diagnosis not present

## 2021-12-31 DIAGNOSIS — E1142 Type 2 diabetes mellitus with diabetic polyneuropathy: Secondary | ICD-10-CM | POA: Diagnosis not present

## 2021-12-31 DIAGNOSIS — E785 Hyperlipidemia, unspecified: Secondary | ICD-10-CM | POA: Diagnosis not present

## 2021-12-31 DIAGNOSIS — I77811 Abdominal aortic ectasia: Secondary | ICD-10-CM | POA: Diagnosis not present

## 2021-12-31 DIAGNOSIS — E1122 Type 2 diabetes mellitus with diabetic chronic kidney disease: Secondary | ICD-10-CM | POA: Diagnosis not present

## 2021-12-31 DIAGNOSIS — N183 Chronic kidney disease, stage 3 unspecified: Secondary | ICD-10-CM | POA: Diagnosis not present

## 2021-12-31 DIAGNOSIS — M5136 Other intervertebral disc degeneration, lumbar region: Secondary | ICD-10-CM | POA: Diagnosis not present

## 2021-12-31 DIAGNOSIS — M1A00X0 Idiopathic chronic gout, unspecified site, without tophus (tophi): Secondary | ICD-10-CM | POA: Diagnosis not present

## 2021-12-31 DIAGNOSIS — Z0000 Encounter for general adult medical examination without abnormal findings: Secondary | ICD-10-CM | POA: Diagnosis not present

## 2022-02-01 DIAGNOSIS — N183 Chronic kidney disease, stage 3 unspecified: Secondary | ICD-10-CM | POA: Diagnosis not present

## 2022-02-01 DIAGNOSIS — E785 Hyperlipidemia, unspecified: Secondary | ICD-10-CM | POA: Diagnosis not present

## 2022-02-01 DIAGNOSIS — I10 Essential (primary) hypertension: Secondary | ICD-10-CM | POA: Diagnosis not present

## 2022-02-01 DIAGNOSIS — M1A00X0 Idiopathic chronic gout, unspecified site, without tophus (tophi): Secondary | ICD-10-CM | POA: Diagnosis not present

## 2022-02-01 DIAGNOSIS — I77811 Abdominal aortic ectasia: Secondary | ICD-10-CM | POA: Diagnosis not present

## 2022-02-01 DIAGNOSIS — E1122 Type 2 diabetes mellitus with diabetic chronic kidney disease: Secondary | ICD-10-CM | POA: Diagnosis not present

## 2022-02-01 DIAGNOSIS — M5136 Other intervertebral disc degeneration, lumbar region: Secondary | ICD-10-CM | POA: Diagnosis not present

## 2022-02-01 DIAGNOSIS — E1142 Type 2 diabetes mellitus with diabetic polyneuropathy: Secondary | ICD-10-CM | POA: Diagnosis not present

## 2022-02-10 DIAGNOSIS — N289 Disorder of kidney and ureter, unspecified: Secondary | ICD-10-CM | POA: Diagnosis not present

## 2022-02-21 DIAGNOSIS — M47816 Spondylosis without myelopathy or radiculopathy, lumbar region: Secondary | ICD-10-CM | POA: Diagnosis not present

## 2022-03-02 DIAGNOSIS — M47816 Spondylosis without myelopathy or radiculopathy, lumbar region: Secondary | ICD-10-CM | POA: Diagnosis not present

## 2022-03-16 DIAGNOSIS — M47816 Spondylosis without myelopathy or radiculopathy, lumbar region: Secondary | ICD-10-CM | POA: Diagnosis not present

## 2022-04-14 DIAGNOSIS — M47816 Spondylosis without myelopathy or radiculopathy, lumbar region: Secondary | ICD-10-CM | POA: Diagnosis not present

## 2022-04-26 DIAGNOSIS — E1142 Type 2 diabetes mellitus with diabetic polyneuropathy: Secondary | ICD-10-CM | POA: Diagnosis not present

## 2022-04-26 DIAGNOSIS — E1122 Type 2 diabetes mellitus with diabetic chronic kidney disease: Secondary | ICD-10-CM | POA: Diagnosis not present

## 2022-04-26 DIAGNOSIS — E785 Hyperlipidemia, unspecified: Secondary | ICD-10-CM | POA: Diagnosis not present

## 2022-04-26 DIAGNOSIS — I10 Essential (primary) hypertension: Secondary | ICD-10-CM | POA: Diagnosis not present

## 2022-04-26 DIAGNOSIS — M1A00X0 Idiopathic chronic gout, unspecified site, without tophus (tophi): Secondary | ICD-10-CM | POA: Diagnosis not present

## 2022-04-26 DIAGNOSIS — N183 Chronic kidney disease, stage 3 unspecified: Secondary | ICD-10-CM | POA: Diagnosis not present

## 2022-05-02 DIAGNOSIS — E1142 Type 2 diabetes mellitus with diabetic polyneuropathy: Secondary | ICD-10-CM | POA: Diagnosis not present

## 2022-05-02 DIAGNOSIS — E1122 Type 2 diabetes mellitus with diabetic chronic kidney disease: Secondary | ICD-10-CM | POA: Diagnosis not present

## 2022-05-02 DIAGNOSIS — N183 Chronic kidney disease, stage 3 unspecified: Secondary | ICD-10-CM | POA: Diagnosis not present

## 2022-05-02 DIAGNOSIS — E785 Hyperlipidemia, unspecified: Secondary | ICD-10-CM | POA: Diagnosis not present

## 2022-05-02 DIAGNOSIS — I10 Essential (primary) hypertension: Secondary | ICD-10-CM | POA: Diagnosis not present

## 2022-05-02 DIAGNOSIS — M5136 Other intervertebral disc degeneration, lumbar region: Secondary | ICD-10-CM | POA: Diagnosis not present

## 2022-05-02 DIAGNOSIS — M1A00X0 Idiopathic chronic gout, unspecified site, without tophus (tophi): Secondary | ICD-10-CM | POA: Diagnosis not present

## 2022-05-02 DIAGNOSIS — E039 Hypothyroidism, unspecified: Secondary | ICD-10-CM | POA: Diagnosis not present

## 2022-05-02 DIAGNOSIS — I77811 Abdominal aortic ectasia: Secondary | ICD-10-CM | POA: Diagnosis not present

## 2022-05-10 DIAGNOSIS — I10 Essential (primary) hypertension: Secondary | ICD-10-CM | POA: Diagnosis not present

## 2022-05-19 DIAGNOSIS — M47816 Spondylosis without myelopathy or radiculopathy, lumbar region: Secondary | ICD-10-CM | POA: Diagnosis not present

## 2022-05-19 DIAGNOSIS — M5416 Radiculopathy, lumbar region: Secondary | ICD-10-CM | POA: Diagnosis not present

## 2022-05-19 DIAGNOSIS — M48062 Spinal stenosis, lumbar region with neurogenic claudication: Secondary | ICD-10-CM | POA: Diagnosis not present

## 2022-05-20 ENCOUNTER — Other Ambulatory Visit: Payer: Self-pay | Admitting: Physical Medicine and Rehabilitation

## 2022-05-20 DIAGNOSIS — M5416 Radiculopathy, lumbar region: Secondary | ICD-10-CM

## 2022-05-25 DIAGNOSIS — E039 Hypothyroidism, unspecified: Secondary | ICD-10-CM | POA: Diagnosis not present

## 2022-05-25 DIAGNOSIS — E785 Hyperlipidemia, unspecified: Secondary | ICD-10-CM | POA: Diagnosis not present

## 2022-05-25 DIAGNOSIS — E1142 Type 2 diabetes mellitus with diabetic polyneuropathy: Secondary | ICD-10-CM | POA: Diagnosis not present

## 2022-05-25 DIAGNOSIS — I10 Essential (primary) hypertension: Secondary | ICD-10-CM | POA: Diagnosis not present

## 2022-06-01 ENCOUNTER — Other Ambulatory Visit: Payer: HMO

## 2022-06-01 ENCOUNTER — Ambulatory Visit
Admission: RE | Admit: 2022-06-01 | Discharge: 2022-06-01 | Disposition: A | Discharge status: Home / Self Care | Payer: HMO | Source: Ambulatory Visit | Attending: Physical Medicine and Rehabilitation | Admitting: Physical Medicine and Rehabilitation

## 2022-06-01 DIAGNOSIS — M545 Low back pain, unspecified: Secondary | ICD-10-CM | POA: Diagnosis not present

## 2022-06-01 DIAGNOSIS — M48061 Spinal stenosis, lumbar region without neurogenic claudication: Secondary | ICD-10-CM | POA: Diagnosis not present

## 2022-06-01 DIAGNOSIS — M5416 Radiculopathy, lumbar region: Secondary | ICD-10-CM

## 2022-06-01 MED ORDER — GADOBENATE DIMEGLUMINE 529 MG/ML IV SOLN: 18.0000 mL | Freq: Once | INTRAVENOUS | Status: DC | PRN

## 2022-06-03 DIAGNOSIS — N183 Chronic kidney disease, stage 3 unspecified: Secondary | ICD-10-CM | POA: Diagnosis not present

## 2022-06-03 DIAGNOSIS — E1122 Type 2 diabetes mellitus with diabetic chronic kidney disease: Secondary | ICD-10-CM | POA: Diagnosis not present

## 2022-06-03 DIAGNOSIS — I10 Essential (primary) hypertension: Secondary | ICD-10-CM | POA: Diagnosis not present

## 2022-06-03 DIAGNOSIS — I77811 Abdominal aortic ectasia: Secondary | ICD-10-CM | POA: Diagnosis not present

## 2022-06-03 DIAGNOSIS — M5136 Other intervertebral disc degeneration, lumbar region: Secondary | ICD-10-CM | POA: Diagnosis not present

## 2022-06-03 DIAGNOSIS — E785 Hyperlipidemia, unspecified: Secondary | ICD-10-CM | POA: Diagnosis not present

## 2022-06-03 DIAGNOSIS — M1A00X0 Idiopathic chronic gout, unspecified site, without tophus (tophi): Secondary | ICD-10-CM | POA: Diagnosis not present

## 2022-06-03 DIAGNOSIS — E1142 Type 2 diabetes mellitus with diabetic polyneuropathy: Secondary | ICD-10-CM | POA: Diagnosis not present

## 2022-06-09 DIAGNOSIS — M5126 Other intervertebral disc displacement, lumbar region: Secondary | ICD-10-CM | POA: Diagnosis not present

## 2022-06-09 DIAGNOSIS — M5416 Radiculopathy, lumbar region: Secondary | ICD-10-CM | POA: Diagnosis not present

## 2022-06-09 DIAGNOSIS — M48062 Spinal stenosis, lumbar region with neurogenic claudication: Secondary | ICD-10-CM | POA: Diagnosis not present

## 2022-07-13 DIAGNOSIS — M5416 Radiculopathy, lumbar region: Secondary | ICD-10-CM | POA: Diagnosis not present

## 2022-07-13 DIAGNOSIS — M5126 Other intervertebral disc displacement, lumbar region: Secondary | ICD-10-CM | POA: Diagnosis not present

## 2022-07-13 DIAGNOSIS — M48062 Spinal stenosis, lumbar region with neurogenic claudication: Secondary | ICD-10-CM | POA: Diagnosis not present

## 2022-08-04 DIAGNOSIS — M5416 Radiculopathy, lumbar region: Secondary | ICD-10-CM | POA: Diagnosis not present

## 2022-08-04 DIAGNOSIS — M5126 Other intervertebral disc displacement, lumbar region: Secondary | ICD-10-CM | POA: Diagnosis not present

## 2022-08-04 DIAGNOSIS — M48062 Spinal stenosis, lumbar region with neurogenic claudication: Secondary | ICD-10-CM | POA: Diagnosis not present

## 2022-08-19 DIAGNOSIS — E113293 Type 2 diabetes mellitus with mild nonproliferative diabetic retinopathy without macular edema, bilateral: Secondary | ICD-10-CM | POA: Diagnosis not present

## 2022-08-19 DIAGNOSIS — H2513 Age-related nuclear cataract, bilateral: Secondary | ICD-10-CM | POA: Diagnosis not present

## 2022-08-19 DIAGNOSIS — H5203 Hypermetropia, bilateral: Secondary | ICD-10-CM | POA: Diagnosis not present

## 2022-08-19 DIAGNOSIS — H1045 Other chronic allergic conjunctivitis: Secondary | ICD-10-CM | POA: Diagnosis not present

## 2023-01-19 DIAGNOSIS — M5441 Lumbago with sciatica, right side: Secondary | ICD-10-CM | POA: Diagnosis not present

## 2023-01-19 DIAGNOSIS — M9903 Segmental and somatic dysfunction of lumbar region: Secondary | ICD-10-CM | POA: Diagnosis not present

## 2023-01-19 DIAGNOSIS — M461 Sacroiliitis, not elsewhere classified: Secondary | ICD-10-CM | POA: Diagnosis not present

## 2023-01-19 DIAGNOSIS — M9904 Segmental and somatic dysfunction of sacral region: Secondary | ICD-10-CM | POA: Diagnosis not present

## 2023-01-23 DIAGNOSIS — M461 Sacroiliitis, not elsewhere classified: Secondary | ICD-10-CM | POA: Diagnosis not present

## 2023-01-23 DIAGNOSIS — M9903 Segmental and somatic dysfunction of lumbar region: Secondary | ICD-10-CM | POA: Diagnosis not present

## 2023-01-23 DIAGNOSIS — M9904 Segmental and somatic dysfunction of sacral region: Secondary | ICD-10-CM | POA: Diagnosis not present

## 2023-01-23 DIAGNOSIS — M5441 Lumbago with sciatica, right side: Secondary | ICD-10-CM | POA: Diagnosis not present

## 2023-02-02 DIAGNOSIS — M9904 Segmental and somatic dysfunction of sacral region: Secondary | ICD-10-CM | POA: Diagnosis not present

## 2023-02-02 DIAGNOSIS — M5441 Lumbago with sciatica, right side: Secondary | ICD-10-CM | POA: Diagnosis not present

## 2023-02-02 DIAGNOSIS — M461 Sacroiliitis, not elsewhere classified: Secondary | ICD-10-CM | POA: Diagnosis not present

## 2023-02-02 DIAGNOSIS — M9903 Segmental and somatic dysfunction of lumbar region: Secondary | ICD-10-CM | POA: Diagnosis not present

## 2023-03-08 DIAGNOSIS — E1142 Type 2 diabetes mellitus with diabetic polyneuropathy: Secondary | ICD-10-CM | POA: Diagnosis not present

## 2023-03-08 DIAGNOSIS — E1122 Type 2 diabetes mellitus with diabetic chronic kidney disease: Secondary | ICD-10-CM | POA: Diagnosis not present

## 2023-03-08 DIAGNOSIS — N183 Chronic kidney disease, stage 3 unspecified: Secondary | ICD-10-CM | POA: Diagnosis not present

## 2023-03-08 DIAGNOSIS — E785 Hyperlipidemia, unspecified: Secondary | ICD-10-CM | POA: Diagnosis not present

## 2023-03-15 DIAGNOSIS — I77811 Abdominal aortic ectasia: Secondary | ICD-10-CM | POA: Diagnosis not present

## 2023-03-15 DIAGNOSIS — E1159 Type 2 diabetes mellitus with other circulatory complications: Secondary | ICD-10-CM | POA: Diagnosis not present

## 2023-03-15 DIAGNOSIS — N183 Chronic kidney disease, stage 3 unspecified: Secondary | ICD-10-CM | POA: Diagnosis not present

## 2023-03-15 DIAGNOSIS — I10 Essential (primary) hypertension: Secondary | ICD-10-CM | POA: Diagnosis not present

## 2023-03-15 DIAGNOSIS — E1169 Type 2 diabetes mellitus with other specified complication: Secondary | ICD-10-CM | POA: Diagnosis not present

## 2023-03-15 DIAGNOSIS — I152 Hypertension secondary to endocrine disorders: Secondary | ICD-10-CM | POA: Diagnosis not present

## 2023-03-15 DIAGNOSIS — M1A00X0 Idiopathic chronic gout, unspecified site, without tophus (tophi): Secondary | ICD-10-CM | POA: Diagnosis not present

## 2023-03-15 DIAGNOSIS — E1142 Type 2 diabetes mellitus with diabetic polyneuropathy: Secondary | ICD-10-CM | POA: Diagnosis not present

## 2023-03-15 DIAGNOSIS — E785 Hyperlipidemia, unspecified: Secondary | ICD-10-CM | POA: Diagnosis not present

## 2023-03-15 DIAGNOSIS — Z0000 Encounter for general adult medical examination without abnormal findings: Secondary | ICD-10-CM | POA: Diagnosis not present

## 2023-03-15 DIAGNOSIS — Z794 Long term (current) use of insulin: Secondary | ICD-10-CM | POA: Diagnosis not present

## 2023-03-15 DIAGNOSIS — E063 Autoimmune thyroiditis: Secondary | ICD-10-CM | POA: Diagnosis not present

## 2023-03-15 DIAGNOSIS — E1122 Type 2 diabetes mellitus with diabetic chronic kidney disease: Secondary | ICD-10-CM | POA: Diagnosis not present

## 2023-03-15 DIAGNOSIS — M5136 Other intervertebral disc degeneration, lumbar region: Secondary | ICD-10-CM | POA: Diagnosis not present

## 2023-03-15 DIAGNOSIS — E039 Hypothyroidism, unspecified: Secondary | ICD-10-CM | POA: Diagnosis not present

## 2023-04-18 DIAGNOSIS — M1A00X0 Idiopathic chronic gout, unspecified site, without tophus (tophi): Secondary | ICD-10-CM | POA: Diagnosis not present

## 2023-04-18 DIAGNOSIS — E039 Hypothyroidism, unspecified: Secondary | ICD-10-CM | POA: Diagnosis not present

## 2023-04-18 DIAGNOSIS — I10 Essential (primary) hypertension: Secondary | ICD-10-CM | POA: Diagnosis not present

## 2023-06-29 DIAGNOSIS — M222X2 Patellofemoral disorders, left knee: Secondary | ICD-10-CM | POA: Diagnosis not present

## 2023-06-29 DIAGNOSIS — M1A9XX1 Chronic gout, unspecified, with tophus (tophi): Secondary | ICD-10-CM | POA: Diagnosis not present

## 2023-07-19 DIAGNOSIS — Z794 Long term (current) use of insulin: Secondary | ICD-10-CM | POA: Diagnosis not present

## 2023-07-19 DIAGNOSIS — E1142 Type 2 diabetes mellitus with diabetic polyneuropathy: Secondary | ICD-10-CM | POA: Diagnosis not present

## 2023-07-19 DIAGNOSIS — E063 Autoimmune thyroiditis: Secondary | ICD-10-CM | POA: Diagnosis not present

## 2023-07-19 DIAGNOSIS — E1169 Type 2 diabetes mellitus with other specified complication: Secondary | ICD-10-CM | POA: Diagnosis not present

## 2023-07-19 DIAGNOSIS — I152 Hypertension secondary to endocrine disorders: Secondary | ICD-10-CM | POA: Diagnosis not present

## 2023-07-19 DIAGNOSIS — E785 Hyperlipidemia, unspecified: Secondary | ICD-10-CM | POA: Diagnosis not present

## 2023-07-19 DIAGNOSIS — E1159 Type 2 diabetes mellitus with other circulatory complications: Secondary | ICD-10-CM | POA: Diagnosis not present

## 2023-07-24 DIAGNOSIS — M85862 Other specified disorders of bone density and structure, left lower leg: Secondary | ICD-10-CM | POA: Diagnosis not present

## 2023-07-24 DIAGNOSIS — M1712 Unilateral primary osteoarthritis, left knee: Secondary | ICD-10-CM | POA: Diagnosis not present

## 2023-09-07 DIAGNOSIS — I10 Essential (primary) hypertension: Secondary | ICD-10-CM | POA: Diagnosis not present

## 2023-09-07 DIAGNOSIS — E1122 Type 2 diabetes mellitus with diabetic chronic kidney disease: Secondary | ICD-10-CM | POA: Diagnosis not present

## 2023-09-07 DIAGNOSIS — N183 Chronic kidney disease, stage 3 unspecified: Secondary | ICD-10-CM | POA: Diagnosis not present

## 2023-09-07 DIAGNOSIS — E785 Hyperlipidemia, unspecified: Secondary | ICD-10-CM | POA: Diagnosis not present

## 2023-09-07 DIAGNOSIS — E039 Hypothyroidism, unspecified: Secondary | ICD-10-CM | POA: Diagnosis not present

## 2023-09-14 DIAGNOSIS — I10 Essential (primary) hypertension: Secondary | ICD-10-CM | POA: Diagnosis not present

## 2023-09-14 DIAGNOSIS — M1A00X0 Idiopathic chronic gout, unspecified site, without tophus (tophi): Secondary | ICD-10-CM | POA: Diagnosis not present

## 2023-09-14 DIAGNOSIS — N183 Chronic kidney disease, stage 3 unspecified: Secondary | ICD-10-CM | POA: Diagnosis not present

## 2023-09-14 DIAGNOSIS — M51362 Other intervertebral disc degeneration, lumbar region with discogenic back pain and lower extremity pain: Secondary | ICD-10-CM | POA: Diagnosis not present

## 2023-09-14 DIAGNOSIS — E1122 Type 2 diabetes mellitus with diabetic chronic kidney disease: Secondary | ICD-10-CM | POA: Diagnosis not present

## 2023-09-14 DIAGNOSIS — E785 Hyperlipidemia, unspecified: Secondary | ICD-10-CM | POA: Diagnosis not present

## 2023-09-14 DIAGNOSIS — E1142 Type 2 diabetes mellitus with diabetic polyneuropathy: Secondary | ICD-10-CM | POA: Diagnosis not present

## 2023-09-14 DIAGNOSIS — M79672 Pain in left foot: Secondary | ICD-10-CM | POA: Diagnosis not present

## 2023-09-14 DIAGNOSIS — I77811 Abdominal aortic ectasia: Secondary | ICD-10-CM | POA: Diagnosis not present

## 2023-10-11 DIAGNOSIS — N183 Chronic kidney disease, stage 3 unspecified: Secondary | ICD-10-CM | POA: Diagnosis not present

## 2023-10-11 DIAGNOSIS — E1122 Type 2 diabetes mellitus with diabetic chronic kidney disease: Secondary | ICD-10-CM | POA: Diagnosis not present

## 2023-10-26 DIAGNOSIS — E875 Hyperkalemia: Secondary | ICD-10-CM | POA: Diagnosis not present

## 2023-11-15 DIAGNOSIS — H40013 Open angle with borderline findings, low risk, bilateral: Secondary | ICD-10-CM | POA: Diagnosis not present

## 2023-11-15 DIAGNOSIS — H5203 Hypermetropia, bilateral: Secondary | ICD-10-CM | POA: Diagnosis not present

## 2023-11-15 DIAGNOSIS — H2513 Age-related nuclear cataract, bilateral: Secondary | ICD-10-CM | POA: Diagnosis not present

## 2023-11-15 DIAGNOSIS — E113293 Type 2 diabetes mellitus with mild nonproliferative diabetic retinopathy without macular edema, bilateral: Secondary | ICD-10-CM | POA: Diagnosis not present

## 2023-11-15 DIAGNOSIS — H1045 Other chronic allergic conjunctivitis: Secondary | ICD-10-CM | POA: Diagnosis not present

## 2023-11-22 DIAGNOSIS — E1142 Type 2 diabetes mellitus with diabetic polyneuropathy: Secondary | ICD-10-CM | POA: Diagnosis not present

## 2023-11-22 DIAGNOSIS — E1159 Type 2 diabetes mellitus with other circulatory complications: Secondary | ICD-10-CM | POA: Diagnosis not present

## 2023-11-22 DIAGNOSIS — I152 Hypertension secondary to endocrine disorders: Secondary | ICD-10-CM | POA: Diagnosis not present

## 2023-11-22 DIAGNOSIS — E785 Hyperlipidemia, unspecified: Secondary | ICD-10-CM | POA: Diagnosis not present

## 2023-11-22 DIAGNOSIS — E1169 Type 2 diabetes mellitus with other specified complication: Secondary | ICD-10-CM | POA: Diagnosis not present

## 2023-11-22 DIAGNOSIS — E063 Autoimmune thyroiditis: Secondary | ICD-10-CM | POA: Diagnosis not present

## 2023-11-22 DIAGNOSIS — Z794 Long term (current) use of insulin: Secondary | ICD-10-CM | POA: Diagnosis not present

## 2024-03-08 DIAGNOSIS — N183 Chronic kidney disease, stage 3 unspecified: Secondary | ICD-10-CM | POA: Diagnosis not present

## 2024-03-08 DIAGNOSIS — E1142 Type 2 diabetes mellitus with diabetic polyneuropathy: Secondary | ICD-10-CM | POA: Diagnosis not present

## 2024-03-08 DIAGNOSIS — M1A00X0 Idiopathic chronic gout, unspecified site, without tophus (tophi): Secondary | ICD-10-CM | POA: Diagnosis not present

## 2024-03-08 DIAGNOSIS — E785 Hyperlipidemia, unspecified: Secondary | ICD-10-CM | POA: Diagnosis not present

## 2024-03-08 DIAGNOSIS — E1122 Type 2 diabetes mellitus with diabetic chronic kidney disease: Secondary | ICD-10-CM | POA: Diagnosis not present

## 2024-03-08 DIAGNOSIS — I77811 Abdominal aortic ectasia: Secondary | ICD-10-CM | POA: Diagnosis not present

## 2024-03-22 DIAGNOSIS — M1A9XX0 Chronic gout, unspecified, without tophus (tophi): Secondary | ICD-10-CM | POA: Diagnosis not present

## 2024-03-22 DIAGNOSIS — Z0000 Encounter for general adult medical examination without abnormal findings: Secondary | ICD-10-CM | POA: Diagnosis not present

## 2024-03-22 DIAGNOSIS — N183 Chronic kidney disease, stage 3 unspecified: Secondary | ICD-10-CM | POA: Diagnosis not present

## 2024-03-22 DIAGNOSIS — E785 Hyperlipidemia, unspecified: Secondary | ICD-10-CM | POA: Diagnosis not present

## 2024-03-22 DIAGNOSIS — E1142 Type 2 diabetes mellitus with diabetic polyneuropathy: Secondary | ICD-10-CM | POA: Diagnosis not present

## 2024-03-22 DIAGNOSIS — I77811 Abdominal aortic ectasia: Secondary | ICD-10-CM | POA: Diagnosis not present

## 2024-03-22 DIAGNOSIS — M51362 Other intervertebral disc degeneration, lumbar region with discogenic back pain and lower extremity pain: Secondary | ICD-10-CM | POA: Diagnosis not present

## 2024-03-22 DIAGNOSIS — Z79891 Long term (current) use of opiate analgesic: Secondary | ICD-10-CM | POA: Diagnosis not present

## 2024-03-22 DIAGNOSIS — E1122 Type 2 diabetes mellitus with diabetic chronic kidney disease: Secondary | ICD-10-CM | POA: Diagnosis not present

## 2024-03-22 DIAGNOSIS — I10 Essential (primary) hypertension: Secondary | ICD-10-CM | POA: Diagnosis not present

## 2024-03-27 DIAGNOSIS — E1142 Type 2 diabetes mellitus with diabetic polyneuropathy: Secondary | ICD-10-CM | POA: Diagnosis not present

## 2024-03-27 DIAGNOSIS — E063 Autoimmune thyroiditis: Secondary | ICD-10-CM | POA: Diagnosis not present

## 2024-03-27 DIAGNOSIS — E1169 Type 2 diabetes mellitus with other specified complication: Secondary | ICD-10-CM | POA: Diagnosis not present

## 2024-03-27 DIAGNOSIS — E785 Hyperlipidemia, unspecified: Secondary | ICD-10-CM | POA: Diagnosis not present

## 2024-03-27 DIAGNOSIS — Z794 Long term (current) use of insulin: Secondary | ICD-10-CM | POA: Diagnosis not present

## 2024-03-27 DIAGNOSIS — E1159 Type 2 diabetes mellitus with other circulatory complications: Secondary | ICD-10-CM | POA: Diagnosis not present

## 2024-03-27 DIAGNOSIS — I152 Hypertension secondary to endocrine disorders: Secondary | ICD-10-CM | POA: Diagnosis not present

## 2024-06-20 DIAGNOSIS — B351 Tinea unguium: Secondary | ICD-10-CM | POA: Diagnosis not present

## 2024-06-20 DIAGNOSIS — L851 Acquired keratosis [keratoderma] palmaris et plantaris: Secondary | ICD-10-CM | POA: Diagnosis not present

## 2024-06-20 DIAGNOSIS — E1140 Type 2 diabetes mellitus with diabetic neuropathy, unspecified: Secondary | ICD-10-CM | POA: Diagnosis not present

## 2024-06-20 DIAGNOSIS — M778 Other enthesopathies, not elsewhere classified: Secondary | ICD-10-CM | POA: Diagnosis not present

## 2024-07-11 DIAGNOSIS — Z794 Long term (current) use of insulin: Secondary | ICD-10-CM | POA: Diagnosis not present

## 2024-07-11 DIAGNOSIS — E1169 Type 2 diabetes mellitus with other specified complication: Secondary | ICD-10-CM | POA: Diagnosis not present

## 2024-07-11 DIAGNOSIS — E1159 Type 2 diabetes mellitus with other circulatory complications: Secondary | ICD-10-CM | POA: Diagnosis not present

## 2024-07-11 DIAGNOSIS — I152 Hypertension secondary to endocrine disorders: Secondary | ICD-10-CM | POA: Diagnosis not present

## 2024-07-11 DIAGNOSIS — E1142 Type 2 diabetes mellitus with diabetic polyneuropathy: Secondary | ICD-10-CM | POA: Diagnosis not present

## 2024-07-11 DIAGNOSIS — E063 Autoimmune thyroiditis: Secondary | ICD-10-CM | POA: Diagnosis not present

## 2024-07-11 DIAGNOSIS — E785 Hyperlipidemia, unspecified: Secondary | ICD-10-CM | POA: Diagnosis not present

## 2024-08-21 DIAGNOSIS — I10 Essential (primary) hypertension: Secondary | ICD-10-CM | POA: Diagnosis not present

## 2024-08-21 DIAGNOSIS — E1122 Type 2 diabetes mellitus with diabetic chronic kidney disease: Secondary | ICD-10-CM | POA: Diagnosis not present

## 2024-08-21 DIAGNOSIS — I77811 Abdominal aortic ectasia: Secondary | ICD-10-CM | POA: Diagnosis not present

## 2024-08-21 DIAGNOSIS — M51362 Other intervertebral disc degeneration, lumbar region with discogenic back pain and lower extremity pain: Secondary | ICD-10-CM | POA: Diagnosis not present

## 2024-08-21 DIAGNOSIS — R29898 Other symptoms and signs involving the musculoskeletal system: Secondary | ICD-10-CM | POA: Diagnosis not present

## 2024-08-21 DIAGNOSIS — E1142 Type 2 diabetes mellitus with diabetic polyneuropathy: Secondary | ICD-10-CM | POA: Diagnosis not present

## 2024-08-21 DIAGNOSIS — N183 Chronic kidney disease, stage 3 unspecified: Secondary | ICD-10-CM | POA: Diagnosis not present

## 2024-08-21 DIAGNOSIS — M1A9XX0 Chronic gout, unspecified, without tophus (tophi): Secondary | ICD-10-CM | POA: Diagnosis not present

## 2024-08-21 DIAGNOSIS — M5412 Radiculopathy, cervical region: Secondary | ICD-10-CM | POA: Diagnosis not present

## 2024-08-22 ENCOUNTER — Other Ambulatory Visit: Payer: Self-pay | Admitting: Internal Medicine

## 2024-08-22 DIAGNOSIS — R29898 Other symptoms and signs involving the musculoskeletal system: Secondary | ICD-10-CM

## 2024-08-22 DIAGNOSIS — I10 Essential (primary) hypertension: Secondary | ICD-10-CM

## 2024-08-22 DIAGNOSIS — M5412 Radiculopathy, cervical region: Secondary | ICD-10-CM

## 2024-08-22 NOTE — Telephone Encounter (Signed)
 Patient states he would like a call back regarding two medications he had discussed discontinuing but forget which two. He states remembering the Metformin  but can't remember the other.   Please advise. [phone]

## 2024-08-22 NOTE — Telephone Encounter (Signed)
 Per Queenie[facility], the [facility] pharmacy has this in house. She will confirm and transfer prescription. Patient will be notified by [facility] of where to pick up.

## 2024-08-23 ENCOUNTER — Other Ambulatory Visit: Payer: Self-pay

## 2024-08-23 ENCOUNTER — Observation Stay
Admission: EM | Admit: 2024-08-23 | Discharge: 2024-08-24 | Disposition: A | Discharge status: Home / Self Care | Source: Ambulatory Visit | Attending: Hospitalist | Admitting: Hospitalist

## 2024-08-23 ENCOUNTER — Observation Stay

## 2024-08-23 ENCOUNTER — Emergency Department

## 2024-08-23 DIAGNOSIS — R7989 Other specified abnormal findings of blood chemistry: Secondary | ICD-10-CM | POA: Diagnosis present

## 2024-08-23 DIAGNOSIS — R531 Weakness: Secondary | ICD-10-CM | POA: Insufficient documentation

## 2024-08-23 DIAGNOSIS — E1142 Type 2 diabetes mellitus with diabetic polyneuropathy: Secondary | ICD-10-CM | POA: Insufficient documentation

## 2024-08-23 DIAGNOSIS — Z79899 Other long term (current) drug therapy: Secondary | ICD-10-CM | POA: Insufficient documentation

## 2024-08-23 DIAGNOSIS — Z87891 Personal history of nicotine dependence: Secondary | ICD-10-CM | POA: Insufficient documentation

## 2024-08-23 DIAGNOSIS — E875 Hyperkalemia: Secondary | ICD-10-CM | POA: Diagnosis not present

## 2024-08-23 DIAGNOSIS — N200 Calculus of kidney: Secondary | ICD-10-CM | POA: Insufficient documentation

## 2024-08-23 DIAGNOSIS — I129 Hypertensive chronic kidney disease with stage 1 through stage 4 chronic kidney disease, or unspecified chronic kidney disease: Secondary | ICD-10-CM | POA: Insufficient documentation

## 2024-08-23 DIAGNOSIS — N179 Acute kidney failure, unspecified: Principal | ICD-10-CM | POA: Insufficient documentation

## 2024-08-23 DIAGNOSIS — Z23 Encounter for immunization: Secondary | ICD-10-CM | POA: Diagnosis not present

## 2024-08-23 DIAGNOSIS — Z794 Long term (current) use of insulin: Secondary | ICD-10-CM | POA: Diagnosis not present

## 2024-08-23 DIAGNOSIS — E785 Hyperlipidemia, unspecified: Secondary | ICD-10-CM | POA: Diagnosis not present

## 2024-08-23 DIAGNOSIS — E1122 Type 2 diabetes mellitus with diabetic chronic kidney disease: Secondary | ICD-10-CM | POA: Insufficient documentation

## 2024-08-23 DIAGNOSIS — I10 Essential (primary) hypertension: Secondary | ICD-10-CM | POA: Diagnosis not present

## 2024-08-23 DIAGNOSIS — N1831 Chronic kidney disease, stage 3a: Secondary | ICD-10-CM | POA: Insufficient documentation

## 2024-08-23 LAB — RESP PANEL BY RT-PCR (RSV, FLU A&B, COVID)  RVPGX2
Influenza A by PCR: NEGATIVE
Influenza B by PCR: NEGATIVE
Resp Syncytial Virus by PCR: NEGATIVE
SARS Coronavirus 2 by RT PCR: NEGATIVE

## 2024-08-23 LAB — CBC WITH DIFFERENTIAL/PLATELET
Abs Immature Granulocytes: 0.04 K/uL (ref 0.00–0.07)
Basophils Absolute: 0.1 K/uL (ref 0.0–0.1)
Basophils Relative: 1 %
Eosinophils Absolute: 0.2 K/uL (ref 0.0–0.5)
Eosinophils Relative: 2 %
HCT: 36.3 % — ABNORMAL LOW (ref 39.0–52.0)
Hemoglobin: 11.9 g/dL — ABNORMAL LOW (ref 13.0–17.0)
Immature Granulocytes: 1 %
Lymphocytes Relative: 24 %
Lymphs Abs: 2.1 K/uL (ref 0.7–4.0)
MCH: 32.2 pg (ref 26.0–34.0)
MCHC: 32.8 g/dL (ref 30.0–36.0)
MCV: 98.1 fL (ref 80.0–100.0)
Monocytes Absolute: 0.5 K/uL (ref 0.1–1.0)
Monocytes Relative: 6 %
Neutro Abs: 5.8 K/uL (ref 1.7–7.7)
Neutrophils Relative %: 66 %
Platelets: 207 K/uL (ref 150–400)
RBC: 3.7 MIL/uL — ABNORMAL LOW (ref 4.22–5.81)
RDW: 14.4 % (ref 11.5–15.5)
WBC: 8.7 K/uL (ref 4.0–10.5)
nRBC: 0 % (ref 0.0–0.2)

## 2024-08-23 LAB — URINALYSIS, ROUTINE W REFLEX MICROSCOPIC
Bacteria, UA: NONE SEEN
Bilirubin Urine: NEGATIVE
Glucose, UA: 150 mg/dL — AB
Hgb urine dipstick: NEGATIVE
Ketones, ur: NEGATIVE mg/dL
Leukocytes,Ua: NEGATIVE
Nitrite: NEGATIVE
Protein, ur: NEGATIVE mg/dL
Specific Gravity, Urine: 1.013 (ref 1.005–1.030)
Squamous Epithelial / HPF: 0 /HPF (ref 0–5)
WBC, UA: 0 WBC/hpf (ref 0–5)
pH: 5 (ref 5.0–8.0)

## 2024-08-23 LAB — COMPREHENSIVE METABOLIC PANEL WITH GFR
ALT: 19 U/L (ref 0–44)
AST: 20 U/L (ref 15–41)
Albumin: 4 g/dL (ref 3.5–5.0)
Alkaline Phosphatase: 92 U/L (ref 38–126)
Anion gap: 10 (ref 5–15)
BUN: 45 mg/dL — ABNORMAL HIGH (ref 8–23)
CO2: 15 mmol/L — ABNORMAL LOW (ref 22–32)
Calcium: 9.2 mg/dL (ref 8.9–10.3)
Chloride: 106 mmol/L (ref 98–111)
Creatinine, Ser: 1.68 mg/dL — ABNORMAL HIGH (ref 0.61–1.24)
GFR, Estimated: 43 mL/min — ABNORMAL LOW (ref >60)
Glucose, Bld: 269 mg/dL — ABNORMAL HIGH (ref 70–99)
Potassium: 6.2 mmol/L — ABNORMAL HIGH (ref 3.5–5.1)
Sodium: 131 mmol/L — ABNORMAL LOW (ref 135–145)
Total Bilirubin: 0.9 mg/dL (ref 0.0–1.2)
Total Protein: 6.6 g/dL (ref 6.5–8.1)

## 2024-08-23 LAB — CK: Total CK: 132 U/L (ref 49–397)

## 2024-08-23 LAB — GLUCOSE, CAPILLARY: Glucose-Capillary: 189 mg/dL — ABNORMAL HIGH (ref 70–99)

## 2024-08-23 LAB — MAGNESIUM: Magnesium: 1.3 mg/dL — ABNORMAL LOW (ref 1.7–2.4)

## 2024-08-23 MED ORDER — POLYETHYLENE GLYCOL 3350 17 G PO PACK: 17.0000 g | PACK | Freq: Every day | ORAL | Status: DC | PRN

## 2024-08-23 MED ORDER — INSULIN NPH (HUMAN) (ISOPHANE) 100 UNIT/ML ~~LOC~~ SUSP
24.0000 [IU] | Freq: Two times a day (BID) | SUBCUTANEOUS | Status: DC
  Administered 2024-08-23 – 2024-08-24 (×2): 24 [IU] via SUBCUTANEOUS
  Filled 2024-08-23: qty 10

## 2024-08-23 MED ORDER — SODIUM ZIRCONIUM CYCLOSILICATE 10 G PO PACK
10.0000 g | PACK | Freq: Once | ORAL | Status: AC
  Administered 2024-08-23: 10 g via ORAL
  Filled 2024-08-23 (×2): qty 1

## 2024-08-23 MED ORDER — ENOXAPARIN SODIUM 60 MG/0.6ML IJ SOSY
0.5000 mg/kg | PREFILLED_SYRINGE | INTRAMUSCULAR | Status: DC
  Administered 2024-08-23: 57.5 mg via SUBCUTANEOUS
  Filled 2024-08-23: qty 0.6

## 2024-08-23 MED ORDER — ENOXAPARIN SODIUM 40 MG/0.4ML IJ SOSY: 40.0000 mg | PREFILLED_SYRINGE | INTRAMUSCULAR | Status: DC

## 2024-08-23 MED ORDER — ACETAMINOPHEN 650 MG RE SUPP: 650.0000 mg | Freq: Four times a day (QID) | RECTAL | Status: DC | PRN

## 2024-08-23 MED ORDER — ATORVASTATIN CALCIUM 20 MG PO TABS: 10.0000 mg | ORAL_TABLET | Freq: Every day | ORAL | Status: DC

## 2024-08-23 MED ORDER — INFLUENZA VAC SPLIT HIGH-DOSE 0.5 ML IM SUSY
0.5000 mL | PREFILLED_SYRINGE | INTRAMUSCULAR | Status: AC
  Administered 2024-08-24: 0.5 mL via INTRAMUSCULAR
  Filled 2024-08-23: qty 0.5

## 2024-08-23 MED ORDER — METFORMIN HCL ER 500 MG PO TB24
500.0000 mg | ORAL_TABLET | Freq: Three times a day (TID) | ORAL | Status: DC
Start: 2024-08-23 — End: 2024-08-23

## 2024-08-23 MED ORDER — ACETAMINOPHEN 325 MG PO TABS: 650.0000 mg | ORAL_TABLET | Freq: Four times a day (QID) | ORAL | Status: DC | PRN

## 2024-08-23 MED ORDER — PANTOPRAZOLE SODIUM 40 MG PO TBEC
40.0000 mg | DELAYED_RELEASE_TABLET | Freq: Every day | ORAL | Status: DC
  Administered 2024-08-24: 40 mg via ORAL
  Filled 2024-08-23: qty 1

## 2024-08-23 MED ORDER — LACTATED RINGERS IV BOLUS
1000.0000 mL | Freq: Once | INTRAVENOUS | Status: AC
  Administered 2024-08-23: 1000 mL via INTRAVENOUS

## 2024-08-23 MED ORDER — AMLODIPINE BESYLATE 5 MG PO TABS
10.0000 mg | ORAL_TABLET | Freq: Every day | ORAL | Status: DC
Start: 2024-08-23 — End: 2024-08-24

## 2024-08-23 MED ORDER — SODIUM CHLORIDE 0.9 % IV SOLN
INTRAVENOUS | Status: DC
Start: 2024-08-23 — End: 2024-08-24

## 2024-08-23 MED ORDER — ALLOPURINOL 100 MG PO TABS
100.0000 mg | ORAL_TABLET | Freq: Every day | ORAL | Status: DC
  Administered 2024-08-24: 100 mg via ORAL
  Filled 2024-08-23: qty 1

## 2024-08-23 MED ORDER — METFORMIN HCL ER 750 MG PO TB24: 1500.0000 mg | ORAL_TABLET | Freq: Every day | ORAL | Status: DC

## 2024-08-23 MED ORDER — BENAZEPRIL HCL 20 MG PO TABS
40.0000 mg | ORAL_TABLET | Freq: Every day | ORAL | Status: DC
Start: 2024-08-24 — End: 2024-08-24
  Filled 2024-08-23: qty 2

## 2024-08-23 NOTE — Telephone Encounter (Signed)
 Patient called back. Patient stated he was informed to stop two medications. Patient stated he can remember to stop metformin  but cannot remember the other medication he is to stop taking. Patient requested a call back.  Please advise  [phone]

## 2024-08-23 NOTE — Plan of Care (Signed)

## 2024-08-23 NOTE — ED Provider Notes (Signed)
 [facility] Provider Note    Event Date/Time   First MD Initiated Contact with Patient 08/23/24 1531     (approximate)   History   abnormal labs   HPI  KALI AMBLER is a 73 y.o. male who presents to the ED for evaluation of abnormal labs   I review a PCP visit from 2 days ago.  History of DM, HTN, HLD, CKD who was seen for an acute episode of global weakness, both arms and legs reportedly symmetrically, over multiple days. I reviewed lab work associated with this visit, CBC with WBC of 12.5, CMP potassium of 6.0 creatinine of 1.9.  Repeat BMP earlier today Potassium of 6.7, creatinine of 1.6.  Patient presents to the ED due to these abnormal lab values.  Presents with his brother.  Patient reports he feels fine and has no complaints.  Reports his preceding episodic weakness has resolved, he walked back to the room, reports he is asymptomatic with no concerns.  He reports chronic diarrhea for many months since starting Ozempic earlier this year without acute changes.    Physical Exam   Triage Vital Signs: ED Triage Vitals  Encounter Vitals Group     BP      Girls Systolic BP Percentile      Girls Diastolic BP Percentile      Boys Systolic BP Percentile      Boys Diastolic BP Percentile      Pulse      Resp      Temp      Temp src      SpO2      Weight      Height      Head Circumference      Peak Flow      Pain Score      Pain Loc      Pain Education      Exclude from Growth Chart     Most recent vital signs: Vitals:   08/23/24 1746 08/23/24 1951  BP: (!) 148/66 (!) 144/71  Pulse: 72 72  Resp: 20 16  Temp: (!) 97.5 F (36.4 C) 98.5 F (36.9 C)  SpO2: 98% 98%    General: Awake, no distress.  Well-appearing and conversational, ambulatory independently with a steady gait CV:  Good peripheral perfusion.  Holosystolic murmur, reportedly chronic from the patient Resp:  Normal effort.  Abd:  No distention.  Soft and nontender MSK:  No  deformity noted.  Neuro:  No focal deficits appreciated. Other:     ED Results / Procedures / Treatments   Labs (all labs ordered are listed, but only abnormal results are displayed) Labs Reviewed  CBC WITH DIFFERENTIAL/PLATELET - Abnormal; Notable for the following components:      Result Value   RBC 3.70 (*)    Hemoglobin 11.9 (*)    HCT 36.3 (*)    All other components within normal limits  COMPREHENSIVE METABOLIC PANEL WITH GFR - Abnormal; Notable for the following components:   Sodium 131 (*)    Potassium 6.2 (*)    CO2 15 (*)    Glucose, Bld 269 (*)    BUN 45 (*)    Creatinine, Ser 1.68 (*)    GFR, Estimated 43 (*)    All other components within normal limits  URINALYSIS, ROUTINE W REFLEX MICROSCOPIC - Abnormal; Notable for the following components:   Color, Urine STRAW (*)    APPearance CLEAR (*)    Glucose,  UA 150 (*)    All other components within normal limits  MAGNESIUM  - Abnormal; Notable for the following components:   Magnesium  1.3 (*)    All other components within normal limits  GLUCOSE, CAPILLARY - Abnormal; Notable for the following components:   Glucose-Capillary 189 (*)    All other components within normal limits  RESP PANEL BY RT-PCR (RSV, FLU A&B, COVID)  RVPGX2  CK  BASIC METABOLIC PANEL WITH GFR  CBC    EKG Sinus rhythm with a rate of 81 bpm.  Low voltage.  Normal axis and intervals without clear signs of acute ischemia.  RADIOLOGY   Official radiology report(s): US  Renal Result Date: 08/23/2024 CLINICAL DATA:  Acute renal injury and hyperkalemia EXAM: RENAL / URINARY TRACT ULTRASOUND COMPLETE COMPARISON:  None Available. FINDINGS: Right Kidney: Renal measurements: 11.5 x 6.1 x 6.3 cm. = volume: 233 mL. 7 mm nonobstructing stone is noted in the lower pole. No mass or hydronephrosis is seen. Echogenicity is within normal limits. Left Kidney: Renal measurements: 12.0 x 5.3 x 6.1 cm. = volume: 204 mL. Echogenicity within normal limits. No mass  or hydronephrosis visualized. Bladder: Appears normal for degree of bladder distention. Other: None. IMPRESSION: Nonobstructing right renal stone measuring 7 mm. No other focal abnormality is noted. Electronically Signed   By: Oneil Devonshire M.D.   On: 08/23/2024 20:52    PROCEDURES and INTERVENTIONS:  .1-3 Lead EKG Interpretation  Performed by: Claudene Rover, MD Authorized by: Claudene Rover, MD     Interpretation: normal     ECG rate:  66   ECG rate assessment: normal     Rhythm: sinus rhythm     Ectopy: none     Conduction: normal   .Critical Care  Performed by: Claudene Rover, MD Authorized by: Claudene Rover, MD   Critical care provider statement:    Critical care time (minutes):  30   Critical care time was exclusive of:  Separately billable procedures and treating other patients   Critical care was necessary to treat or prevent imminent or life-threatening deterioration of the following conditions:  Metabolic crisis   Critical care was time spent personally by me on the following activities:  Development of treatment plan with patient or surrogate, discussions with consultants, evaluation of patient's response to treatment, examination of patient, ordering and review of laboratory studies, ordering and review of radiographic studies, ordering and performing treatments and interventions, pulse oximetry, re-evaluation of patient's condition and review of old charts   Medications  0.9 %  sodium chloride  infusion ( Intravenous New Bag/Given 08/23/24 1757)  acetaminophen  (TYLENOL ) tablet 650 mg (has no administration in time range)    Or  acetaminophen  (TYLENOL ) suppository 650 mg (has no administration in time range)  polyethylene glycol (MIRALAX  / GLYCOLAX ) packet 17 g (has no administration in time range)  enoxaparin  (LOVENOX ) injection 57.5 mg (57.5 mg Subcutaneous Given 08/23/24 2154)  allopurinol  (ZYLOPRIM ) tablet 100 mg (has no administration in time range)  amLODipine  (NORVASC )  tablet 10 mg (10 mg Oral Not Given 08/23/24 2100)  atorvastatin  (LIPITOR) tablet 10 mg (10 mg Oral Not Given 08/23/24 2100)  benazepril  (LOTENSIN ) tablet 40 mg (has no administration in time range)  insulin  NPH Human (NOVOLIN N) injection 24 Units (24 Units Subcutaneous Given 08/23/24 2155)  pantoprazole  (PROTONIX ) EC tablet 40 mg (40 mg Oral Not Given 08/23/24 2056)  Influenza vac split trivalent PF (FLUZONE HIGH-DOSE) injection 0.5 mL (has no administration in time range)  metFORMIN  (GLUCOPHAGE -XR) 24 hr  tablet 1,500 mg (has no administration in time range)  lactated ringers  bolus 1,000 mL (1,000 mLs Intravenous New Bag/Given 08/23/24 1542)  sodium zirconium cyclosilicate  (LOKELMA ) packet 10 g (10 g Oral Given 08/23/24 2120)     IMPRESSION / MDM / ASSESSMENT AND PLAN / ED COURSE  I reviewed the triage vital signs and the nursing notes.  Differential diagnosis includes, but is not limited to, renal failure, bladder outlet obstruction, ventricular dysrhythmia, medication side effect  {Patient presents with symptoms of an acute illness or injury that is potentially life-threatening.  Patient presents with AKI and hyperkalemia requiring medical admission.  No EKG changes.  He is asymptomatic and looks well.  No hematologic derangements, negative viral swabs.   Provide IV fluids, Lokelma , renal ultrasound pending at the time of admission  Clinical Course as of 08/23/24 2249  Fri Aug 23, 2024  1648 Reassessed, remains asymptomatic.  Discussed hyperkalemia my recommendation for admission, we discussed possible causes and pending imaging.  He is agreeable [DS]  [phone] Consult with medicine who agrees to admit [DS]    Clinical Course User Index [DS] Claudene Rover, MD     FINAL CLINICAL IMPRESSION(S) / ED DIAGNOSES   Final diagnoses:  Hyperkalemia     Rx / DC Orders   ED Discharge Orders     None        Note:  This document was prepared using Dragon voice recognition software and may  include unintentional dictation errors.   Claudene Rover, MD 08/23/24 2249

## 2024-08-23 NOTE — H&P (Signed)
 History and Physical    Patient: Jerry Pollard FMW:[id number] DOB: 08-21-1951 DOA: 08/23/2024 DOS: the patient was seen and examined on 08/23/2024 PCP: Fernande Ophelia JINNY DOUGLAS, MD  Patient coming from: Home  Chief Complaint:  Chief Complaint  Patient presents with   abnormal labs   HPI: Jerry Pollard is a 73 y.o. male with medical history significant for hypertension, morbid obesity, DM II, CKD III, DJD, Chronic back pain, cervical radiculopathy, and gout. The patient went to his PCP this week complaining of generalized weakness. Labwork was done and the patient was directed to come to the ED due to elevated creatinine and hyperkalemia. EKG demonstrates no changes.   His symptoms now have largely resolved.   In the ED the patient is found to have a creatinine of 1.7 (baseline of 1.2), and a potassium of 6.2. He has a metabolic acidosis with a CO2 of 15. Sodium is low at 131. Magnesium  1.3.  The patient has received Lokelma  and 1L LR in ED. He will be admitted under observation status to a telemetry bed. He will receive supplementation for his low magnesium , IV fluids, and monitoring of his electrolytes and volume status.   He denies fevers, chills, cough, shortness of breath, chest pain, nausea, vomiting, neurological changes or deficits.  Review of Systems: As mentioned in the history of present illness. All other systems reviewed and are negative. Past Medical History:  Diagnosis Date   Aortic ectasia, abdominal (HCC)    Arthritis    BPH (benign prostatic hyperplasia)    Chronic gouty arthritis    Chronic kidney disease    DDD (degenerative disc disease), lumbar    Depression    Diabetes mellitus without complication (HCC)    GERD (gastroesophageal reflux disease)    Gout    Hyperlipidemia    Hypertension    Primary osteoarthritis of knee    left   Primary osteoarthritis of knee    right   Past Surgical History:  Procedure Laterality Date   APPENDECTOMY     BACK SURGERY      CARPAL TUNNEL RELEASE Right    CHOLECYSTECTOMY     COLONOSCOPY     COLONOSCOPY WITH PROPOFOL  N/A 10/08/2018   Procedure: COLONOSCOPY WITH PROPOFOL ;  Surgeon: Viktoria Lamar DASEN, MD;  Location: [facility] ENDOSCOPY;  Service: Endoscopy;  Laterality: N/A;   COLONOSCOPY WITH PROPOFOL  N/A 07/09/2021   Procedure: COLONOSCOPY WITH PROPOFOL ;  Surgeon: Maryruth Ole DASEN, MD;  Location: [facility] ENDOSCOPY;  Service: Endoscopy;  Laterality: N/A;  IDDM   SPINE SURGERY     Social History:  reports that he quit smoking about 27 years ago. His smoking use included cigarettes. He has never used smokeless tobacco. He reports that he does not drink alcohol  and does not use drugs.  Allergies  Allergen Reactions   Crestor [Rosuvastatin] Other (See Comments)    Myalgias    Jardiance [Empagliflozin]     Family History  Problem Relation Age of Onset   Hypertension Mother    Cancer Mother    Cancer Father    Heart attack Brother     Prior to Admission medications   Medication Sig Start Date End Date Taking? Authorizing Provider  allopurinol  (ZYLOPRIM ) 100 MG tablet Take 1 tablet (100 mg total) by mouth daily. 08/05/19   Montell Oneil LABOR, MD  amLODipine  (NORVASC ) 10 MG tablet Take 1 tablet (10 mg total) by mouth daily. 08/05/19   Montell Oneil LABOR, MD  atorvastatin  (LIPITOR) 10 MG tablet Take  1 tablet (10 mg total) by mouth daily. 08/05/19   Montell Oneil LABOR, MD  azelastine (ASTELIN) 0.1 % nasal spray Place 1 spray into both nostrils 2 (two) times daily. Use in each nostril as directed    [provider]  benazepril  (LOTENSIN ) 40 MG tablet Take 1 tablet (40 mg total) by mouth daily. 08/05/19   Montell Oneil LABOR, MD  colchicine  0.6 MG tablet Take 1 tablet (0.6 mg total) by mouth daily. 10/18/18   Montell Oneil LABOR, MD  diclofenac  sodium (VOLTAREN ) 1 % GEL Apply 2 g topically 4 (four) times daily. 08/05/19   Montell Oneil LABOR, MD  fexofenadine  (ALLEGRA ) 180 MG tablet Take 180 mg by mouth daily.    [provider]  fluticasone  (FLONASE ) 50 MCG/ACT nasal spray Place 2 sprays into both nostrils daily. 07/04/18   Montell Oneil LABOR, MD  FLUZONE HIGH-DOSE QUADRIVALENT 0.7 ML SUSY ADM 0.7ML IM UTD 08/02/19   [provider]  gabapentin  (NEURONTIN ) 300 MG capsule Take 1 capsule (300 mg total) by mouth 2 (two) times daily. 08/05/19   Montell Oneil LABOR, MD  glucose blood (ONE TOUCH ULTRA TEST) test strip 100 each by Other route See admin instructions. Use as instructed 11/27/19   Stuart Vernell Norris, PA-C  insulin  aspart (NOVOLOG  FLEXPEN) 100 UNIT/ML FlexPen INJECT 18 UNITS UNDER THE SKIN 2 TIMES A DAY Sliding scale 08/30/19   Montell Oneil LABOR, MD  insulin  NPH Human (NOVOLIN N) 100 UNIT/ML injection Inject into the skin. Injection 24 units in the morning and 24 units at night    [provider]  Lancets (ONETOUCH ULTRASOFT) lancets Use as instructed 10/29/18   Montell Oneil LABOR, MD  metFORMIN  (GLUCOPHAGE -XR) 500 MG 24 hr tablet Take 1 tablet (500 mg total) by mouth 3 (three) times daily. 08/05/19   Montell Oneil LABOR, MD  pantoprazole  (PROTONIX ) 40 MG tablet Take 1 tablet (40 mg total) by mouth daily. 08/05/19   Montell Oneil LABOR, MD  traMADol  (ULTRAM ) 50 MG tablet Take 1 tablet (50 mg total) by mouth daily as needed. 08/05/19   Montell Oneil LABOR, MD    Physical Exam: Vitals:   08/23/24 1533 08/23/24 1534  Pulse:  68  Resp:  18  Temp:  98 F (36.7 C)  TempSrc:  Oral  Weight: 113.4 kg   Height: 6' 1 (1.854 m)    Exam:  Constitutional:  The patient is awake, alert, and oriented x 3. No acute distress. Eyes:  pupils and irises appear normal Normal lids and conjunctivae ENMT:  grossly normal hearing  Lips appear normal external ears, nose appear normal Oropharynx: mucosa, tongue,posterior pharynx appear normal Neck:  neck appears normal, no masses, normal ROM, supple no thyromegaly Respiratory:  No increased work of breathing. No wheezes, rales, or rhonchi No tactile  fremitus Cardiovascular:  Regular rate and rhythm No murmurs, ectopy, or gallups. No lateral PMI. No thrills. Abdomen:  Abdomen is soft, non-tender, non-distended No hernias, masses, or organomegaly Normoactive bowel sounds.  Musculoskeletal:  No cyanosis, clubbing, or edema Skin:  No rashes, lesions, ulcers palpation of skin: no induration or nodules Neurologic:  CN 2-12 intact Sensation all 4 extremities intact Psychiatric:  Mental status Mood, affect appropriate Orientation to person, place, time  judgment and insight appear intact  Data Reviewed:  CBC BMP  Assessment and Plan: Acute renal failure superimposed on stage 3a chronic kidney disease (HCC) Baseline creatinine is 1.2. Patient presents with creatinine of 1.7. Renal ultrasound is pending. The patient  received 1 L LR in the ED. Will monitor electrolytes, volume status, and renal status. He will receive IV NS.  Essential hypertension The patient takes amlodipine  10 mg daily and lotensin  40 mg daily for HTN. It will be continued as inpatient. The patient is currently hypertensive with 163/72, Monitor.  Hyperkalemia Potassium at presentation is 6.2. He has received Lokelma . He will be continued on NS. Monitor electrolytes.  Hyperlipidemia Noted. The patient will be continued on lipitor.  Type 2 diabetes mellitus with peripheral neuropathy (HCC) At home the patient's glucoses are controlled on 24 units of NPH bid and Novolog  18 units bid. The NPH will be continued and his glucoses will be followed with FSBS and SSI.  Hypomagnesemia Magnesium  is low at 1.3. Will give 4 gm IV x 1 and follow. Will probably need more.  I have seen and examined this patient myself. I have spent 53 minutes in his evaluation and care.   Advance Care Planning:   Code Status: Full Code   Consults: None  Family Communication: None available.  Severity of Illness: The appropriate patient status for this patient is OBSERVATION.  Observation status is judged to be reasonable and necessary in order to provide the required intensity of service to ensure the patient's safety. The patient's presenting symptoms, physical exam findings, and initial radiographic and laboratory data in the context of their medical condition is felt to place them at decreased risk for further clinical deterioration. Furthermore, it is anticipated that the patient will be medically stable for discharge from the hospital within 2 midnights of admission.   Author: Aubry Rankin, DO 08/23/2024 5:39 PM  For on call review [email or web address].

## 2024-08-23 NOTE — Assessment & Plan Note (Addendum)
 At home the patient's glucoses are controlled on 24 units of NPH bid and Novolog  18 units bid. The NPH will be continued and his glucoses will be followed with FSBS and SSI. The patient was recently placed on Ozempic. He states that since doing that he has had a lot of trouble with diarrhea. Pharmacy feels it may at least in part be responsible for his hyperkalemia. I have advised the patient not to restart the medication upon discharge.

## 2024-08-23 NOTE — ED Triage Notes (Signed)
 Pt to ed via POV for abnormal labs. Pt has been seen at [facility] office this week for weakness in his legs and had labs collected and was called today and was told he had low K+ and to go straight to the ER. Pt is caox4, in no acute distress and ambulatory to room 16.

## 2024-08-23 NOTE — Assessment & Plan Note (Signed)
 Potassium at presentation is 6.2. He has received Lokelma . He will be continued on NS. Monitor electrolytes.

## 2024-08-23 NOTE — Assessment & Plan Note (Addendum)
 The patient takes amlodipine  10 mg daily and lotensin  40 mg daily for HTN. It will be continued as inpatient. The patient is currently hypertensive with 163/72, Monitor.

## 2024-08-23 NOTE — Progress Notes (Signed)
 PHARMACIST - PHYSICIAN COMMUNICATION  CONCERNING:  Enoxaparin  (Lovenox ) for DVT Prophylaxis    RECOMMENDATION: Patient was prescribed enoxaprin 40mg  q24 hours for VTE prophylaxis.   Filed Weights   08/23/24 1533  Weight: 113.4 kg (250 lb)    Body mass index is 32.98 kg/m.  Estimated Creatinine Clearance: 51.7 mL/min (A) (by C-G formula based on SCr of 1.68 mg/dL (H)).   Based on [facility] policy patient is candidate for enoxaparin  0.5mg /kg TBW SQ every 24 hours based on BMI being >30.  DESCRIPTION: Pharmacy has adjusted enoxaparin  dose per [facility] policy.  Patient is now receiving enoxaparin  57.5 mg every 24 hours    Olam KANDICE Fritter, PharmD Clinical Pharmacist  08/23/2024 5:04 PM

## 2024-08-23 NOTE — Assessment & Plan Note (Signed)
 Magnesium  is low at 1.3. Will give 4 gm IV x 1 and follow. Will probably need more.

## 2024-08-23 NOTE — Assessment & Plan Note (Addendum)
 Baseline creatinine is 1.2. Patient presents with creatinine of 1.7. Renal ultrasound is pending. The patient received 1 L LR in the ED. Will monitor electrolytes, volume status, and renal status. He will receive IV NS.

## 2024-08-23 NOTE — Telephone Encounter (Signed)
 This patient called and stated that he was on the phone with Dr. Celine nurse about this concern and he lost connection.  Please call Jerry Pollard back at [phone]

## 2024-08-23 NOTE — Assessment & Plan Note (Signed)
 Noted. The patient will be continued on lipitor.

## 2024-08-24 DIAGNOSIS — E875 Hyperkalemia: Secondary | ICD-10-CM | POA: Diagnosis not present

## 2024-08-24 LAB — CBC
HCT: 34.6 % — ABNORMAL LOW (ref 39.0–52.0)
Hemoglobin: 11.5 g/dL — ABNORMAL LOW (ref 13.0–17.0)
MCH: 32.1 pg (ref 26.0–34.0)
MCHC: 33.2 g/dL (ref 30.0–36.0)
MCV: 96.6 fL (ref 80.0–100.0)
Platelets: 192 K/uL (ref 150–400)
RBC: 3.58 MIL/uL — ABNORMAL LOW (ref 4.22–5.81)
RDW: 14 % (ref 11.5–15.5)
WBC: 6.6 K/uL (ref 4.0–10.5)
nRBC: 0 % (ref 0.0–0.2)

## 2024-08-24 LAB — MAGNESIUM
Magnesium: 1.2 mg/dL — ABNORMAL LOW (ref 1.7–2.4)
Magnesium: 1.9 mg/dL (ref 1.7–2.4)

## 2024-08-24 LAB — BASIC METABOLIC PANEL WITH GFR
Anion gap: 5 (ref 5–15)
BUN: 33 mg/dL — ABNORMAL HIGH (ref 8–23)
CO2: 20 mmol/L — ABNORMAL LOW (ref 22–32)
Calcium: 9.1 mg/dL (ref 8.9–10.3)
Chloride: 112 mmol/L — ABNORMAL HIGH (ref 98–111)
Creatinine, Ser: 1.22 mg/dL (ref 0.61–1.24)
GFR, Estimated: >60 mL/min (ref >60)
Glucose, Bld: 120 mg/dL — ABNORMAL HIGH (ref 70–99)
Potassium: 5.2 mmol/L — ABNORMAL HIGH (ref 3.5–5.1)
Sodium: 137 mmol/L (ref 135–145)

## 2024-08-24 LAB — GLUCOSE, CAPILLARY
Glucose-Capillary: 142 mg/dL — ABNORMAL HIGH (ref 70–99)
Glucose-Capillary: 230 mg/dL — ABNORMAL HIGH (ref 70–99)

## 2024-08-24 MED ORDER — BENAZEPRIL HCL 20 MG PO TABS
20.0000 mg | ORAL_TABLET | ORAL | Status: DC
  Filled 2024-08-24: qty 1

## 2024-08-24 MED ORDER — GABAPENTIN 300 MG PO CAPS: 300.0000 mg | ORAL_CAPSULE | Freq: Every day | ORAL | Status: AC

## 2024-08-24 MED ORDER — FEXOFENADINE HCL 180 MG PO TABS: 180.0000 mg | ORAL_TABLET | Freq: Every day | ORAL | Status: AC | PRN

## 2024-08-24 MED ORDER — MAGNESIUM SULFATE 4 GM/100ML IV SOLN
4.0000 g | Freq: Once | INTRAVENOUS | Status: DC
Start: 2024-08-24 — End: 2024-08-24
  Filled 2024-08-24: qty 100

## 2024-08-24 MED ORDER — SODIUM CHLORIDE 0.9 % IV SOLN: INTRAVENOUS | Status: DC

## 2024-08-24 MED ORDER — PANTOPRAZOLE SODIUM 40 MG PO TBEC: 40.0000 mg | DELAYED_RELEASE_TABLET | ORAL | Status: AC

## 2024-08-24 MED ORDER — DICLOFENAC SODIUM 1 % TD GEL: 2.0000 g | Freq: Four times a day (QID) | TRANSDERMAL | Status: AC | PRN

## 2024-08-24 MED ORDER — FLUTICASONE PROPIONATE 50 MCG/ACT NA SUSP: 2.0000 | Freq: Every day | NASAL | Status: AC | PRN

## 2024-08-24 MED ORDER — BENAZEPRIL HCL 40 MG PO TABS: 20.0000 mg | ORAL_TABLET | ORAL | Status: AC

## 2024-08-24 MED ORDER — SODIUM ZIRCONIUM CYCLOSILICATE 10 G PO PACK
10.0000 g | PACK | Freq: Once | ORAL | Status: AC
Start: 2024-08-24 — End: 2024-08-24
  Administered 2024-08-24: 10 g via ORAL
  Filled 2024-08-24: qty 1

## 2024-08-24 MED ORDER — MAGNESIUM SULFATE 2 GM/50ML IV SOLN: 2.0000 g | Freq: Once | INTRAVENOUS | Status: DC

## 2024-08-24 MED ORDER — MAGNESIUM SULFATE 4 GM/100ML IV SOLN
4.0000 g | Freq: Once | INTRAVENOUS | Status: AC
  Administered 2024-08-24: 4 g via INTRAVENOUS
  Filled 2024-08-24: qty 100

## 2024-08-24 NOTE — Discharge Summary (Signed)
 Physician Discharge Summary   Jerry Pollard  male DOB: [birth year]  FMW:[id number]  PCP: Fernande Ophelia JINNY DOUGLAS, MD  Admit date: 08/23/2024 Discharge date: 08/24/2024  Admitted From: home Disposition:  home CODE STATUS: Full code  Discharge Instructions     Discharge instructions   Complete by: As directed    Please follow up with your PCP to have your potassium and magnesium  levels checked within a week after discharge. [facility] Course:  For full details, please see H&P, progress notes, consult notes and ancillary notes.  Briefly,  Jerry Pollard is a 73 y.o. male with medical history significant for hypertension, morbid obesity, DM II, CKD III, DJD, Chronic back pain, cervical radiculopathy.   The patient went to his PCP complaining of generalized weakness. Labwork was done and the patient was directed to come to the ED due to elevated creatinine and hyperkalemia.   Acute renal failure superimposed on stage 3a chronic kidney disease (HCC) Baseline creatinine is 1.2. Patient presented with creatinine of 1.7.  --renal US  found Nonobstructing right renal stone measuring 7 mm but otherwise wnl. --Cr improved to 1.22 after IVF.   Essential hypertension --not taking amlodipine  PTA. --cont home benazopril 20 mg daily   Hyperkalemia Potassium at presentation is 6.2.  --improved with lokelma  10g x2 and IVF.   Hyperlipidemia --cont home statin   Type 2 diabetes mellitus with peripheral neuropathy (HCC) --Last A1c 7.5. --not taking metformin  PTA --discharged on home regime as below.  Hypomagnesemia --monitored and supplemented PRN   Unless noted above, medications under STOP list are ones pt was not taking PTA.  Discharge Diagnoses:  Principal Problem:   Hyperkalemia Active Problems:   Type 2 diabetes mellitus with peripheral neuropathy (HCC)   Hyperlipidemia   Essential hypertension   Acute renal failure superimposed on stage 3a chronic kidney disease  (HCC)   Hypomagnesemia     Discharge Instructions:  Allergies as of 08/24/2024       Reactions   Crestor [rosuvastatin] Other (See Comments)   Myalgias   Jardiance [empagliflozin]         Medication List     STOP taking these medications    amLODipine  10 MG tablet Commonly known as: NORVASC    azelastine 0.1 % nasal spray Commonly known as: ASTELIN   colchicine  0.6 MG tablet   Fluzone High-Dose Quadrivalent 0.7 ML Susy Generic drug: Influenza Vac High-Dose Quad   metFORMIN  500 MG 24 hr tablet Commonly known as: GLUCOPHAGE -XR   NovoLOG  FlexPen 100 UNIT/ML FlexPen Generic drug: insulin  aspart   ONE TOUCH ULTRA TEST test strip Generic drug: glucose blood   traMADol  50 MG tablet Commonly known as: ULTRAM        TAKE these medications    allopurinol  100 MG tablet Commonly known as: ZYLOPRIM  Take 1 tablet (100 mg total) by mouth daily.   atorvastatin  10 MG tablet Commonly known as: LIPITOR Take 1 tablet (10 mg total) by mouth daily.   benazepril  40 MG tablet Commonly known as: LOTENSIN  Take 0.5 tablets (20 mg total) by mouth every other day. Home med. What changed:  how much to take when to take this additional instructions   diclofenac  sodium 1 % Gel Commonly known as: VOLTAREN  Apply 2 g topically 4 (four) times daily as needed. Home med. What changed:  when to take this reasons to take this additional instructions   fexofenadine  180 MG tablet Commonly known as: ALLEGRA  Take 1 tablet (180  mg total) by mouth daily as needed for allergies or rhinitis. Home med. What changed:  when to take this reasons to take this additional instructions   fluticasone  50 MCG/ACT nasal spray Commonly known as: FLONASE  Place 2 sprays into both nostrils daily as needed for allergies or rhinitis. Home med. What changed:  when to take this reasons to take this additional instructions   gabapentin  300 MG capsule Commonly known as: NEURONTIN  Take 1 capsule  (300 mg total) by mouth at bedtime. Home med. What changed:  when to take this additional instructions   insulin  NPH Human 100 UNIT/ML injection Commonly known as: NOVOLIN N Inject into the skin. Injection 24 units in the morning and 24 units at night   onetouch ultrasoft lancets Use as instructed   pantoprazole  40 MG tablet Commonly known as: PROTONIX  Take 1 tablet (40 mg total) by mouth every other day. Home med. What changed:  when to take this additional instructions         Follow-up Information     Fernande Ophelia PARAS III, MD Follow up in 1 week(s).   Specialty: Internal Medicine Contact information: [address] [facility] [place] [address] [phone]                 Allergies  Allergen Reactions   Crestor [Rosuvastatin] Other (See Comments)    Myalgias    Jardiance [Empagliflozin]      The results of significant diagnostics from this hospitalization (including imaging, microbiology, ancillary and laboratory) are listed below for reference.   Consultations:   Procedures/Studies: US  Renal Result Date: 08/23/2024 CLINICAL DATA:  Acute renal injury and hyperkalemia EXAM: RENAL / URINARY TRACT ULTRASOUND COMPLETE COMPARISON:  None Available. FINDINGS: Right Kidney: Renal measurements: 11.5 x 6.1 x 6.3 cm. = volume: 233 mL. 7 mm nonobstructing stone is noted in the lower pole. No mass or hydronephrosis is seen. Echogenicity is within normal limits. Left Kidney: Renal measurements: 12.0 x 5.3 x 6.1 cm. = volume: 204 mL. Echogenicity within normal limits. No mass or hydronephrosis visualized. Bladder: Appears normal for degree of bladder distention. Other: None. IMPRESSION: Nonobstructing right renal stone measuring 7 mm. No other focal abnormality is noted. Electronically Signed   By: Oneil Devonshire M.D.   On: 08/23/2024 20:52      Labs: BNP (last 3 results) No results for input(s): BNP in the last 8760 hours. Basic Metabolic  Panel: Recent Labs  Lab 08/23/24 1539 08/24/24 0431 08/24/24 1504  NA 131* 137  --   K 6.2* 5.2*  --   CL 106 112*  --   CO2 15* 20*  --   GLUCOSE 269* 120*  --   BUN 45* 33*  --   CREATININE 1.68* 1.22  --   CALCIUM  9.2 9.1  --   MG 1.3* 1.2* 1.9   Liver Function Tests: Recent Labs  Lab 08/23/24 1539  AST 20  ALT 19  ALKPHOS 92  BILITOT 0.9  PROT 6.6  ALBUMIN 4.0   No results for input(s): LIPASE, AMYLASE in the last 168 hours. No results for input(s): AMMONIA in the last 168 hours. CBC: Recent Labs  Lab 08/23/24 1539 08/24/24 0431  WBC 8.7 6.6  NEUTROABS 5.8  --   HGB 11.9* 11.5*  HCT 36.3* 34.6*  MCV 98.1 96.6  PLT 207 192   Cardiac Enzymes: Recent Labs  Lab 08/23/24 1539  CKTOTAL 132   BNP: Invalid input(s): POCBNP CBG: Recent Labs  Lab  08/23/24 2100 08/24/24 0915 08/24/24 1137  GLUCAP 189* 142* 230*   D-Dimer No results for input(s): DDIMER in the last 72 hours. Hgb A1c No results for input(s): HGBA1C in the last 72 hours. Lipid Profile No results for input(s): CHOL, HDL, LDLCALC, TRIG, CHOLHDL, LDLDIRECT in the last 72 hours. Thyroid  function studies No results for input(s): TSH, T4TOTAL, T3FREE, THYROIDAB in the last 72 hours.  Invalid input(s): FREET3 Anemia work up No results for input(s): VITAMINB12, FOLATE, FERRITIN, TIBC, IRON, RETICCTPCT in the last 72 hours. Urinalysis    Component Value Date/Time   COLORURINE STRAW (A) 08/23/2024 1702   APPEARANCEUR CLEAR (A) 08/23/2024 1702   APPEARANCEUR Clear 08/16/2019 0836   LABSPEC 1.013 08/23/2024 1702   LABSPEC 1.018 03/25/2015 1146   PHURINE 5.0 08/23/2024 1702   GLUCOSEU 150 (A) 08/23/2024 1702   GLUCOSEU Negative 03/25/2015 1146   HGBUR NEGATIVE 08/23/2024 1702   BILIRUBINUR NEGATIVE 08/23/2024 1702   BILIRUBINUR Negative 08/16/2019 0836   BILIRUBINUR Negative 03/25/2015 1146   KETONESUR NEGATIVE 08/23/2024 1702   PROTEINUR  NEGATIVE 08/23/2024 1702   NITRITE NEGATIVE 08/23/2024 1702   LEUKOCYTESUR NEGATIVE 08/23/2024 1702   LEUKOCYTESUR Negative 03/25/2015 1146   Sepsis Labs Recent Labs  Lab 08/23/24 1539 08/24/24 0431  WBC 8.7 6.6   Microbiology Recent Results (from the past 240 hours)  Resp panel by RT-PCR (RSV, Flu A&B, Covid) Anterior Nasal Swab     Status: None   Collection Time: 08/23/24  3:39 PM   Specimen: Anterior Nasal Swab  Result Value Ref Range Status   SARS Coronavirus 2 by RT PCR NEGATIVE NEGATIVE Final    Comment: (NOTE) SARS-CoV-2 target nucleic acids are NOT DETECTED.  The SARS-CoV-2 RNA is generally detectable in upper respiratory specimens during the acute phase of infection. The lowest concentration of SARS-CoV-2 viral copies this assay can detect is 138 copies/mL. A negative result does not preclude SARS-Cov-2 infection and should not be used as the sole basis for treatment or other patient management decisions. A negative result may occur with  improper specimen collection/handling, submission of specimen other than nasopharyngeal swab, presence of viral mutation(s) within the areas targeted by this assay, and inadequate number of viral copies(<138 copies/mL). A negative result must be combined with clinical observations, patient history, and epidemiological information. The expected result is Negative.  Fact Sheet for Patients:  BloggerCourse.com  Fact Sheet for Healthcare Providers:  SeriousBroker.it  This test is no t yet approved or cleared by the [place]  FDA and  has been authorized for detection and/or diagnosis of SARS-CoV-2 by FDA under an Emergency Use Authorization (EUA). This EUA will remain  in effect (meaning this test can be used) for the duration of the COVID-19 declaration under Section 564(b)(1) of the Act, 21 U.S.C.section 360bbb-3(b)(1), unless the authorization is terminated  or revoked  sooner.       Influenza A by PCR NEGATIVE NEGATIVE Final   Influenza B by PCR NEGATIVE NEGATIVE Final    Comment: (NOTE) The Xpert Xpress SARS-CoV-2/FLU/RSV plus assay is intended as an aid in the diagnosis of influenza from Nasopharyngeal swab specimens and should not be used as a sole basis for treatment. Nasal washings and aspirates are unacceptable for Xpert Xpress SARS-CoV-2/FLU/RSV testing.  Fact Sheet for Patients: BloggerCourse.com  Fact Sheet for Healthcare Providers: SeriousBroker.it  This test is not yet approved or cleared by the [place]  FDA and has been authorized for detection and/or diagnosis of SARS-CoV-2 by FDA under an Emergency Use  Authorization (EUA). This EUA will remain in effect (meaning this test can be used) for the duration of the COVID-19 declaration under Section 564(b)(1) of the Act, 21 U.S.C. section 360bbb-3(b)(1), unless the authorization is terminated or revoked.     Resp Syncytial Virus by PCR NEGATIVE NEGATIVE Final    Comment: (NOTE) Fact Sheet for Patients: BloggerCourse.com  Fact Sheet for Healthcare Providers: SeriousBroker.it  This test is not yet approved or cleared by the [place]  FDA and has been authorized for detection and/or diagnosis of SARS-CoV-2 by FDA under an Emergency Use Authorization (EUA). This EUA will remain in effect (meaning this test can be used) for the duration of the COVID-19 declaration under Section 564(b)(1) of the Act, 21 U.S.C. section 360bbb-3(b)(1), unless the authorization is terminated or revoked.  Performed at [facility], [address] [place], [place] [address]      Total time spend on discharging this patient, including the last patient exam, discussing the hospital stay, instructions for ongoing care as it relates to all pertinent caregivers, as well as preparing the  medical discharge records, prescriptions, and/or referrals as applicable, is 40 minutes.    Ellouise Haber, MD  [facility] 08/24/2024, 4:19 PM

## 2024-08-28 ENCOUNTER — Ambulatory Visit
Admission: RE | Admit: 2024-08-28 | Discharge: 2024-08-28 | Disposition: A | Discharge status: Home / Self Care | Source: Ambulatory Visit | Attending: Internal Medicine | Admitting: Internal Medicine

## 2024-08-28 DIAGNOSIS — M5412 Radiculopathy, cervical region: Secondary | ICD-10-CM | POA: Diagnosis not present

## 2024-08-28 DIAGNOSIS — R29898 Other symptoms and signs involving the musculoskeletal system: Secondary | ICD-10-CM

## 2024-08-28 DIAGNOSIS — M50222 Other cervical disc displacement at C5-C6 level: Secondary | ICD-10-CM | POA: Diagnosis not present

## 2024-08-28 DIAGNOSIS — I10 Essential (primary) hypertension: Secondary | ICD-10-CM | POA: Insufficient documentation

## 2024-08-28 DIAGNOSIS — M4802 Spinal stenosis, cervical region: Secondary | ICD-10-CM | POA: Diagnosis not present

## 2024-08-28 DIAGNOSIS — E875 Hyperkalemia: Secondary | ICD-10-CM | POA: Diagnosis not present

## 2024-08-28 DIAGNOSIS — M4803 Spinal stenosis, cervicothoracic region: Secondary | ICD-10-CM | POA: Diagnosis not present

## 2024-08-28 DIAGNOSIS — M5021 Other cervical disc displacement,  high cervical region: Secondary | ICD-10-CM | POA: Diagnosis not present

## 2024-08-29 ENCOUNTER — Ambulatory Visit

## 2024-09-02 DIAGNOSIS — E875 Hyperkalemia: Secondary | ICD-10-CM | POA: Diagnosis not present

## 2024-09-02 DIAGNOSIS — E1122 Type 2 diabetes mellitus with diabetic chronic kidney disease: Secondary | ICD-10-CM | POA: Diagnosis not present

## 2024-09-02 DIAGNOSIS — I77811 Abdominal aortic ectasia: Secondary | ICD-10-CM | POA: Diagnosis not present

## 2024-09-02 DIAGNOSIS — D649 Anemia, unspecified: Secondary | ICD-10-CM | POA: Diagnosis not present

## 2024-09-02 DIAGNOSIS — E1142 Type 2 diabetes mellitus with diabetic polyneuropathy: Secondary | ICD-10-CM | POA: Diagnosis not present

## 2024-09-02 DIAGNOSIS — I10 Essential (primary) hypertension: Secondary | ICD-10-CM | POA: Diagnosis not present

## 2024-09-02 DIAGNOSIS — M4802 Spinal stenosis, cervical region: Secondary | ICD-10-CM | POA: Diagnosis not present

## 2024-09-02 DIAGNOSIS — N183 Chronic kidney disease, stage 3 unspecified: Secondary | ICD-10-CM | POA: Diagnosis not present

## 2024-09-13 DIAGNOSIS — N183 Chronic kidney disease, stage 3 unspecified: Secondary | ICD-10-CM | POA: Diagnosis not present

## 2024-09-13 DIAGNOSIS — E785 Hyperlipidemia, unspecified: Secondary | ICD-10-CM | POA: Diagnosis not present

## 2024-09-13 DIAGNOSIS — E1122 Type 2 diabetes mellitus with diabetic chronic kidney disease: Secondary | ICD-10-CM | POA: Diagnosis not present

## 2024-09-13 DIAGNOSIS — M1A9XX0 Chronic gout, unspecified, without tophus (tophi): Secondary | ICD-10-CM | POA: Diagnosis not present

## 2024-09-13 DIAGNOSIS — E1142 Type 2 diabetes mellitus with diabetic polyneuropathy: Secondary | ICD-10-CM | POA: Diagnosis not present

## 2024-09-20 DIAGNOSIS — E1142 Type 2 diabetes mellitus with diabetic polyneuropathy: Secondary | ICD-10-CM | POA: Diagnosis not present

## 2024-09-20 DIAGNOSIS — E1122 Type 2 diabetes mellitus with diabetic chronic kidney disease: Secondary | ICD-10-CM | POA: Diagnosis not present

## 2024-09-20 DIAGNOSIS — I77811 Abdominal aortic ectasia: Secondary | ICD-10-CM | POA: Diagnosis not present

## 2024-09-20 DIAGNOSIS — I10 Essential (primary) hypertension: Secondary | ICD-10-CM | POA: Diagnosis not present

## 2024-09-20 DIAGNOSIS — M1A9XX0 Chronic gout, unspecified, without tophus (tophi): Secondary | ICD-10-CM | POA: Diagnosis not present

## 2024-09-20 DIAGNOSIS — E785 Hyperlipidemia, unspecified: Secondary | ICD-10-CM | POA: Diagnosis not present

## 2024-09-20 DIAGNOSIS — N183 Chronic kidney disease, stage 3 unspecified: Secondary | ICD-10-CM | POA: Diagnosis not present

## 2024-09-20 DIAGNOSIS — M51362 Other intervertebral disc degeneration, lumbar region with discogenic back pain and lower extremity pain: Secondary | ICD-10-CM | POA: Diagnosis not present

## 2024-09-20 DIAGNOSIS — M4802 Spinal stenosis, cervical region: Secondary | ICD-10-CM | POA: Diagnosis not present
